# Patient Record
Sex: Female | Born: 1990
Health system: Southern US, Community
[De-identification: ages and names within clinical notes are randomized; demographics above are authoritative.]

## PROBLEM LIST (undated history)

## (undated) ENCOUNTER — Inpatient Hospital Stay (HOSPITAL_COMMUNITY): Payer: Self-pay

## (undated) DIAGNOSIS — I10 Essential (primary) hypertension: Secondary | ICD-10-CM

## (undated) DIAGNOSIS — K8020 Calculus of gallbladder without cholecystitis without obstruction: Secondary | ICD-10-CM

## (undated) DIAGNOSIS — K811 Chronic cholecystitis: Secondary | ICD-10-CM

---
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
## 2010-06-30 ENCOUNTER — Emergency Department (HOSPITAL_COMMUNITY): Admission: EM | Admit: 2010-06-30 | Discharge: 2010-06-30 | Payer: Self-pay | Admitting: Emergency Medicine

## 2011-01-10 NOTE — Op Note (Signed)
NAME:  Kristi Richmond, Kristi Richmond                           ACCOUNT NO.:  [id number]   MEDICAL RECORD NO.:  [id number]                   PATIENT TYPE:  AMB   LOCATION:  DSC                                  FACILITY:  [facility]   PHYSICIAN:  Leonia Corona, M.D.               DATE OF BIRTH:  1990-11-16   DATE OF PROCEDURE:  12/19/2003  DATE OF DISCHARGE:                                 OPERATIVE REPORT   PREOPERATIVE DIAGNOSIS:  Left post-auricular cyst.   POSTOPERATIVE DIAGNOSIS:  Left post-auricular dermoid cyst.   PROCEDURE:  Excision biopsy.   ANESTHESIA:  General laryngeal mask anesthesia.   SURGEON:  Leonia Corona, M.D.   ASSISTANT:  Nurse.   INDICATIONS FOR PROCEDURE:  This 20 year old female child was evaluated for  cystic swelling measuring about 1.5 cm in diameter behind the left ear that  had bene noted since early childhood. However, it was not recently to have  grown in clear clinically consistent with a diagnosis of congenital benign  cyst; hence, the indication for the procedure.   DESCRIPTION OF PROCEDURE:  The patient was brought into the operating room,  placed supine on the operating table and general laryngeal mask anesthesia  was given.  The patient's head was tilted to the right side to make the cyst  prominent.  The ear and the pinna were taped by folding it anteriorly to  expose the cyst completely.  The area was cleaned, prepped and draped in the  usual manner.  A linear vertical excision just sitting over the most  prominent part of the cyst was made measuring about 1.5 cm.  The skin flaps  are raised on either side by sharp and blunt dissection using a fine  instrument.  Once the cyst was freed on both sides, it was lifted off the  base carefully by blunt and sharp dissection.  However, while doing so it  ruptured and serous fluid along with plenty of hair came out which was  suctioned out completely. The entire cyst was carefully lifted off without  leaving any  remnant and the complete cyst came out.  No cyst wall was left  behind.  The wound was irrigated carefully and examined and any bleeding  spots were cauterized. The entire cyst with contents was sent for pathology.  The wound was then closed in two layers; the deeper layer using 6-0 Vicryl  interrupted sutures and the skin with 6-0 Prolene for subcuticular stitch.  Steri-Strips were applied which was covered in sterile gauze and a DuoDerm  dressing.  The patient tolerated the procedure very well  which was  uneventful. The patient was later extubated and transported to the recovery  room in good and stable condition.  Leonia Corona, M.D.   SF/MEDQ  D:  12/19/2003  T:  12/19/2003  Job:  [id number]   cc:   Burnell Blanks, M.D.  [facility]

## 2011-02-05 ENCOUNTER — Other Ambulatory Visit: Payer: Self-pay | Admitting: Family Medicine

## 2011-02-05 DIAGNOSIS — M25512 Pain in left shoulder: Secondary | ICD-10-CM

## 2011-02-05 DIAGNOSIS — M5412 Radiculopathy, cervical region: Secondary | ICD-10-CM

## 2011-02-11 ENCOUNTER — Ambulatory Visit
Admission: RE | Admit: 2011-02-11 | Discharge: 2011-02-11 | Disposition: A | Discharge status: Home / Self Care | Payer: Commercial Managed Care - PPO | Source: Ambulatory Visit | Attending: Family Medicine | Admitting: Family Medicine

## 2011-02-11 DIAGNOSIS — M25512 Pain in left shoulder: Secondary | ICD-10-CM

## 2011-02-11 DIAGNOSIS — M5412 Radiculopathy, cervical region: Secondary | ICD-10-CM

## 2012-03-23 ENCOUNTER — Emergency Department: Payer: Self-pay | Admitting: Emergency Medicine

## 2012-06-24 ENCOUNTER — Emergency Department (HOSPITAL_BASED_OUTPATIENT_CLINIC_OR_DEPARTMENT_OTHER)
Admission: EM | Admit: 2012-06-24 | Discharge: 2012-06-24 | Disposition: A | Discharge status: Home / Self Care | Payer: BC Managed Care – PPO | Attending: Emergency Medicine | Admitting: Emergency Medicine

## 2012-06-24 ENCOUNTER — Encounter (HOSPITAL_BASED_OUTPATIENT_CLINIC_OR_DEPARTMENT_OTHER): Payer: Self-pay

## 2012-06-24 DIAGNOSIS — R51 Headache: Secondary | ICD-10-CM | POA: Insufficient documentation

## 2012-06-24 DIAGNOSIS — Z79899 Other long term (current) drug therapy: Secondary | ICD-10-CM | POA: Insufficient documentation

## 2012-06-24 DIAGNOSIS — I10 Essential (primary) hypertension: Secondary | ICD-10-CM | POA: Insufficient documentation

## 2012-06-24 HISTORY — DX: Essential (primary) hypertension: I10

## 2012-06-24 LAB — BASIC METABOLIC PANEL
BUN: 10 mg/dL (ref 6–23)
Calcium: 9.5 mg/dL (ref 8.4–10.5)
Chloride: 101 mEq/L (ref 96–112)
Creatinine, Ser: 0.8 mg/dL (ref 0.50–1.10)
GFR calc Af Amer: >90 mL/min (ref >90)
Glucose, Bld: 103 mg/dL — ABNORMAL HIGH (ref 70–99)
Potassium: 3.4 mEq/L — ABNORMAL LOW (ref 3.5–5.1)

## 2012-06-24 MED ORDER — DEXAMETHASONE SODIUM PHOSPHATE 10 MG/ML IJ SOLN
10.0000 mg | Freq: Once | INTRAMUSCULAR | Status: AC
  Administered 2012-06-24: 10 mg via INTRAVENOUS
  Filled 2012-06-24: qty 1

## 2012-06-24 MED ORDER — MORPHINE SULFATE 4 MG/ML IJ SOLN
4.0000 mg | Freq: Once | INTRAMUSCULAR | Status: AC
  Administered 2012-06-24: 4 mg via INTRAVENOUS
  Filled 2012-06-24: qty 1

## 2012-06-24 MED ORDER — METOCLOPRAMIDE HCL 5 MG/ML IJ SOLN
10.0000 mg | Freq: Once | INTRAMUSCULAR | Status: AC
  Administered 2012-06-24: 10 mg via INTRAVENOUS
  Filled 2012-06-24: qty 2

## 2012-06-24 MED ORDER — KETOROLAC TROMETHAMINE 30 MG/ML IJ SOLN
30.0000 mg | Freq: Once | INTRAMUSCULAR | Status: AC
  Administered 2012-06-24: 30 mg via INTRAVENOUS
  Filled 2012-06-24: qty 1

## 2012-06-24 MED ORDER — POTASSIUM CHLORIDE CRYS ER 20 MEQ PO TBCR
40.0000 meq | EXTENDED_RELEASE_TABLET | Freq: Once | ORAL | Status: AC
  Administered 2012-06-24: 40 meq via ORAL
  Filled 2012-06-24: qty 2

## 2012-06-24 MED ORDER — HYDROCODONE-ACETAMINOPHEN 5-325 MG PO TABS: 1.0000 | ORAL_TABLET | ORAL | Status: DC | PRN

## 2012-06-24 MED ORDER — IBUPROFEN 600 MG PO TABS: 600.0000 mg | ORAL_TABLET | Freq: Four times a day (QID) | ORAL | Status: DC | PRN

## 2012-06-24 MED ORDER — SODIUM CHLORIDE 0.9 % IV BOLUS (SEPSIS)
1000.0000 mL | Freq: Once | INTRAVENOUS | Status: AC
  Administered 2012-06-24: 1000 mL via INTRAVENOUS

## 2012-06-24 NOTE — ED Provider Notes (Addendum)
History     CSN: [id number]  Arrival date & time 06/24/12  1557   First MD Initiated Contact with Patient 06/24/12 1617      Chief Complaint  Patient presents with  . Headache    (Consider location/radiation/quality/duration/timing/severity/associated sxs/prior treatment) HPI Comments: Pt comes in with cc of headaches. Her headaches started 3 weeks ago, are constant nagging headaches that get more intense occasionally. Pt describes the pain as sharp, located in the front and the back, and there is no nausea, vomiting, visual complains, seizures, altered mental status, loss of consciousness, new weakness, or numbness, no gait instability associated with them.  Pt also reports elevated BP, mostly in the 140s, but has peaked to 180s occasionally. She was seen by PCP, and started on metoprolol in addition to her maxzide. Pt taking tylenol for pain right now.  Patient is a 21 y.o. female presenting with headaches. The history is provided by the patient.  Headache  Pertinent negatives include no shortness of breath, no nausea and no vomiting.    Past Medical History  Diagnosis Date  . Hypertension     History reviewed. No pertinent past surgical history.  No family history on file.  History  Substance Use Topics  . Smoking status: Never Smoker   . Smokeless tobacco: Not on file  . Alcohol Use: No    OB History    Grav Para Term Preterm Abortions TAB SAB Ect Mult Living                  Review of Systems  Constitutional: Negative for activity change.  HENT: Negative for neck pain.   Respiratory: Negative for chest tightness and shortness of breath.   Cardiovascular: Negative for chest pain.  Gastrointestinal: Negative for nausea, vomiting and abdominal pain.  Genitourinary: Negative for dysuria.  Neurological: Positive for headaches.    Allergies  Sulfa antibiotics  Home Medications   Current Outpatient Rx  Name Route Sig Dispense Refill  . METOPROLOL  SUCCINATE ER 50 MG PO TB24 Oral Take 50 mg by mouth daily. Take with or immediately following a meal.    . TRIAMTERENE-HCTZ 75-50 MG PO TABS Oral Take 1 tablet by mouth 2 (two) times daily.      BP 152/96  Pulse 90  Temp 98.3 F (36.8 C)  Resp 20  Ht 5\' 6"  (1.676 m)  Wt 169 lb (76.658 kg)  BMI 27.28 kg/m2  SpO2 100%  LMP 06/23/2012  Physical Exam  Nursing note and vitals reviewed. Constitutional: She is oriented to person, place, and time. She appears well-developed and well-nourished.  HENT:  Head: Normocephalic and atraumatic.  Eyes: EOM are normal. Pupils are equal, round, and reactive to light.  Neck: Neck supple.  Cardiovascular: Normal rate, regular rhythm and normal heart sounds.   No murmur heard. Pulmonary/Chest: Effort normal. No respiratory distress.  Abdominal: Soft. She exhibits no distension. There is no tenderness. There is no rebound and no guarding.  Neurological: She is alert and oriented to person, place, and time. No cranial nerve deficit. Coordination normal.  Skin: Skin is warm and dry.    ED Course  Procedures (including critical care time)   Labs Reviewed  BASIC METABOLIC PANEL   No results found.   No diagnosis found.    MDM  DDX includes: Primary headaches - including migrainous headaches, cluster headaches, tension headaches. ICH Carotid dissection Cavernous sinus thrombosis Meningitis Encephalitis Sinusitis Tumor Vascular headaches AV malformation Brain aneurysm Muscular headaches  A/P:  Pt comes in with cc of headaches. No concerns for life threatening secondary headaches as there are no red flags on hx or exam for them, and the neuro exam is completely normal. Will try to break the headaches.    Derwood Kaplan, MD 06/24/12 1646  5:34 PM Headache improved, but not completely resolved. Will give a dose of decadron. Pain is mild right now, we will discharge her, and request continued follow up with pcp if not  better.  Derwood Kaplan, MD 06/24/12 1735

## 2012-06-24 NOTE — ED Notes (Addendum)
HA x 3 weeks-states PCP has been adjusting HTN meds x 2 weeks-new rx metoprolol

## 2012-09-22 ENCOUNTER — Ambulatory Visit: Payer: Self-pay | Admitting: Podiatry

## 2014-07-20 ENCOUNTER — Emergency Department (HOSPITAL_BASED_OUTPATIENT_CLINIC_OR_DEPARTMENT_OTHER): Payer: BC Managed Care – PPO

## 2014-07-20 ENCOUNTER — Emergency Department (HOSPITAL_BASED_OUTPATIENT_CLINIC_OR_DEPARTMENT_OTHER)
Admission: EM | Admit: 2014-07-20 | Discharge: 2014-07-20 | Disposition: A | Discharge status: Home / Self Care | Payer: BC Managed Care – PPO | Attending: Emergency Medicine | Admitting: Emergency Medicine

## 2014-07-20 ENCOUNTER — Encounter (HOSPITAL_BASED_OUTPATIENT_CLINIC_OR_DEPARTMENT_OTHER): Payer: Self-pay | Admitting: Emergency Medicine

## 2014-07-20 DIAGNOSIS — Z79899 Other long term (current) drug therapy: Secondary | ICD-10-CM | POA: Insufficient documentation

## 2014-07-20 DIAGNOSIS — I10 Essential (primary) hypertension: Secondary | ICD-10-CM | POA: Insufficient documentation

## 2014-07-20 DIAGNOSIS — Z8639 Personal history of other endocrine, nutritional and metabolic disease: Secondary | ICD-10-CM | POA: Insufficient documentation

## 2014-07-20 DIAGNOSIS — R0602 Shortness of breath: Secondary | ICD-10-CM | POA: Insufficient documentation

## 2014-07-20 DIAGNOSIS — R079 Chest pain, unspecified: Secondary | ICD-10-CM | POA: Diagnosis present

## 2014-07-20 LAB — CBC WITH DIFFERENTIAL/PLATELET
Basophils Absolute: 0 10*3/uL (ref 0.0–0.1)
Basophils Relative: 0 % (ref 0–1)
EOS ABS: 0.1 10*3/uL (ref 0.0–0.7)
EOS PCT: 1 % (ref 0–5)
HCT: 34.6 % — ABNORMAL LOW (ref 36.0–46.0)
Hemoglobin: 12.1 g/dL (ref 12.0–15.0)
Lymphocytes Relative: 39 % (ref 12–46)
Lymphs Abs: 3.1 10*3/uL (ref 0.7–4.0)
MCH: 31.5 pg (ref 26.0–34.0)
MCHC: 35 g/dL (ref 30.0–36.0)
MCV: 90.1 fL (ref 78.0–100.0)
Monocytes Absolute: 0.6 10*3/uL (ref 0.1–1.0)
Monocytes Relative: 7 % (ref 3–12)
Neutro Abs: 4.1 10*3/uL (ref 1.7–7.7)
Neutrophils Relative %: 53 % (ref 43–77)
PLATELETS: 274 10*3/uL (ref 150–400)
RBC: 3.84 MIL/uL — ABNORMAL LOW (ref 3.87–5.11)
RDW: 11.9 % (ref 11.5–15.5)
WBC: 7.9 10*3/uL (ref 4.0–10.5)

## 2014-07-20 LAB — BASIC METABOLIC PANEL
ANION GAP: 11 (ref 5–15)
BUN: 11 mg/dL (ref 6–23)
CALCIUM: 9.5 mg/dL (ref 8.4–10.5)
CO2: 25 mEq/L (ref 19–32)
Chloride: 103 mEq/L (ref 96–112)
Creatinine, Ser: 0.9 mg/dL (ref 0.50–1.10)
GFR calc Af Amer: >90 mL/min (ref >90)
GFR, EST NON AFRICAN AMERICAN: 88 mL/min — AB (ref >90)
Glucose, Bld: 94 mg/dL (ref 70–99)
POTASSIUM: 3.9 meq/L (ref 3.7–5.3)
Sodium: 139 mEq/L (ref 137–147)

## 2014-07-20 LAB — TROPONIN I

## 2014-07-20 LAB — D-DIMER, QUANTITATIVE: D-Dimer, Quant: 1.04 ug/mL-FEU — ABNORMAL HIGH (ref 0.00–0.48)

## 2014-07-20 MED ORDER — SODIUM CHLORIDE 0.9 % IV BOLUS (SEPSIS)
1000.0000 mL | Freq: Once | INTRAVENOUS | Status: AC
  Administered 2014-07-20: 1000 mL via INTRAVENOUS

## 2014-07-20 MED ORDER — METOCLOPRAMIDE HCL 5 MG/ML IJ SOLN
10.0000 mg | Freq: Once | INTRAMUSCULAR | Status: AC
  Administered 2014-07-20: 10 mg via INTRAVENOUS
  Filled 2014-07-20: qty 2

## 2014-07-20 MED ORDER — DIPHENHYDRAMINE HCL 50 MG/ML IJ SOLN
25.0000 mg | Freq: Once | INTRAMUSCULAR | Status: AC
  Administered 2014-07-20: 25 mg via INTRAVENOUS
  Filled 2014-07-20: qty 1

## 2014-07-20 MED ORDER — NAPROXEN 500 MG PO TABS: 500.0000 mg | ORAL_TABLET | Freq: Two times a day (BID) | ORAL | Status: DC

## 2014-07-20 MED ORDER — KETOROLAC TROMETHAMINE 30 MG/ML IJ SOLN
30.0000 mg | Freq: Once | INTRAMUSCULAR | Status: AC
  Administered 2014-07-20: 30 mg via INTRAVENOUS
  Filled 2014-07-20: qty 1

## 2014-07-20 MED ORDER — IOHEXOL 350 MG/ML SOLN
100.0000 mL | Freq: Once | INTRAVENOUS | Status: AC | PRN
  Administered 2014-07-20: 100 mL via INTRAVENOUS

## 2014-07-20 NOTE — Discharge Instructions (Signed)
Please follow the directions provided.  Be sure to follow-up with your primary care provider to ensure you are getting better.  You may take the Naproxen twice a day for pain.  Don't hesitate to return for new, worsening or concerning symptoms.     SEEK IMMEDIATE MEDICAL CARE IF:  You have increased chest pain or pain that spreads to your arm, neck, jaw, back, or abdomen.  You have shortness of breath.  You have an increasing cough, or you cough up blood.  You have severe back or abdominal pain.  You feel nauseous or vomit.  You have severe weakness.  You faint.  You have chills.

## 2014-07-20 NOTE — ED Notes (Signed)
Patient transported to CT 

## 2014-07-20 NOTE — ED Notes (Signed)
Lanora ManisElizabeth, FNP at bedside.

## 2014-07-20 NOTE — ED Notes (Signed)
Tightness in right upper chest that is worse with deep breath started at 1pm today.  Also numbness and stiffness in right hand.  Also HA.  All started today with no hx of same.

## 2014-07-20 NOTE — ED Provider Notes (Signed)
CSN: [id number]     Arrival date & time 07/20/14  1611 History   First MD Initiated Contact with Patient 07/20/14 1627     Chief Complaint  Patient presents with  . Chest Pain   (Consider location/radiation/quality/duration/timing/severity/associated sxs/prior Treatment) HPI  Kristi Richmond is a 23 yo female presenting with right sided chest pain x 2 hours.  She noticed the pain as she was sitting down.  She describes it as a constant, sharp pain, worse when she takes a deep breath.  She rates the pain as 6/10.  She also notes having a headache that began 1 hr after the headache. She denies fevers, chills, nausea, vomiting, abd pain or diarrhea.  She does take oral contraception, she denies history of DVT, no hemoptysis, no surgery or unilateral leg swelling.         Past Medical History  Diagnosis Date  . Hypertension   . High cholesterol    History reviewed. No pertinent past surgical history. No family history on file. History  Substance Use Topics  . Smoking status: Never Smoker   . Smokeless tobacco: Not on file  . Alcohol Use: Yes     Comment: occ   OB History    No data available     Review of Systems  Constitutional: Negative for fever and chills.  HENT: Negative for sore throat.   Eyes: Negative for visual disturbance.  Respiratory: Positive for chest tightness and shortness of breath. Negative for cough.   Cardiovascular: Positive for chest pain. Negative for leg swelling.  Gastrointestinal: Negative for nausea, vomiting and diarrhea.  Genitourinary: Negative for dysuria.  Musculoskeletal: Negative for myalgias.  Skin: Negative for rash.  Neurological: Negative for weakness, numbness and headaches.    Allergies  Sulfa antibiotics  Home Medications   Prior to Admission medications   Medication Sig Start Date End Date Taking? Authorizing Provider  HYDROcodone-acetaminophen (NORCO/VICODIN) 5-325 MG per tablet Take 1 tablet by mouth every 4 (four) hours as  needed for pain (Breakthrough headache only). 06/24/12   Derwood Kaplan, MD  ibuprofen (ADVIL,MOTRIN) 600 MG tablet Take 1 tablet (600 mg total) by mouth every 6 (six) hours as needed for pain. 06/24/12   Derwood Kaplan, MD  metoprolol succinate (TOPROL-XL) 50 MG 24 hr tablet Take 50 mg by mouth daily. Take with or immediately following a meal.    Historical Provider, MD  triamterene-hydrochlorothiazide (MAXZIDE) 75-50 MG per tablet Take 1 tablet by mouth 2 (two) times daily.    Historical Provider, MD  triamterene-hydrochlorothiazide (MAXZIDE-25) 37.5-25 MG per tablet Take 1 tablet by mouth daily.   Yes Historical Provider, MD   BP 134/84 mmHg  Pulse 77  Temp(Src) 98.4 F (36.9 C) (Oral)  Resp 16  Ht 5\' 6"  (1.676 m)  Wt 165 lb (74.844 kg)  BMI 26.64 kg/m2  SpO2 100%  LMP 07/06/2014 (Approximate) Physical Exam  Constitutional: She appears well-developed and well-nourished. No distress.  HENT:  Head: Normocephalic and atraumatic.  Mouth/Throat: Oropharynx is clear and moist. No oropharyngeal exudate.  Eyes: Conjunctivae are normal.  Neck: Neck supple. No thyromegaly present.  Cardiovascular: Normal rate, regular rhythm and intact distal pulses.   Pulmonary/Chest: Effort normal and breath sounds normal. No respiratory distress. She has no wheezes. She has no rales. She exhibits no tenderness.    Abdominal: Soft. There is no tenderness.  Musculoskeletal: She exhibits no tenderness.  Lymphadenopathy:    She has no cervical adenopathy.  Neurological: She is alert.  Skin: Skin  is warm and dry. No rash noted. She is not diaphoretic.  Psychiatric: She has a normal mood and affect.  Nursing note and vitals reviewed.   ED Course  Procedures (including critical care time) Labs Review Labs Reviewed  CBC WITH DIFFERENTIAL - Abnormal; Notable for the following:    RBC 3.84 (*)    HCT 34.6 (*)    All other components within normal limits  BASIC METABOLIC PANEL - Abnormal; Notable for  the following:    GFR calc non Af Amer 88 (*)    All other components within normal limits  D-DIMER, QUANTITATIVE - Abnormal; Notable for the following:    D-Dimer, Quant 1.04 (*)    All other components within normal limits  TROPONIN I    Imaging Review Dg Chest 2 View  07/20/2014   CLINICAL DATA:  Sudden right-sided chest pain. Right arm tingling and numbness. Nonsmoker.  EXAM: CHEST  2 VIEW  COMPARISON:  07/01/2005  FINDINGS: The heart size and mediastinal contours are within normal limits. Both lungs are clear. The visualized skeletal structures are unremarkable.  IMPRESSION: No active cardiopulmonary disease.   Electronically Signed   By: Rosalie GumsBeth  Brown M.D.   On: 07/20/2014 16:45   Ct Angio Chest Pe W/cm &/or Wo Cm  07/20/2014   CLINICAL DATA:  Right-sided chest pain, right arm tingling and numbness.  EXAM: CT ANGIOGRAPHY CHEST WITH CONTRAST  TECHNIQUE: Multidetector CT imaging of the chest was performed using the standard protocol during bolus administration of intravenous contrast. Multiplanar CT image reconstructions and MIPs were obtained to evaluate the vascular anatomy.  CONTRAST:  100mL OMNIPAQUE IOHEXOL 350 MG/ML SOLN  COMPARISON:  None.  FINDINGS: Mediastinum: Normal heart size. No pericardial effusion. Trachea and esophagus are normal and midline. No mediastinal or hilar adenopathy identified. The main pulmonary artery appears patent. No abnormal filling defects identified within the lobar or segmental pulmonary arteries.  Lungs/Pleura: There is no pleural effusion identified. There is no airspace consolidation or atelectasis.  Upper Abdomen: Incidental imaging through the upper abdomen is unremarkable.  Musculoskeletal: No acute bone abnormality identified.  Review of the MIP images confirms the above findings.  IMPRESSION: 1. No acute cardiopulmonary abnormalities. No evidence for acute pulmonary embolus.   Electronically Signed   By: Signa Kellaylor  Stroud M.D.   On: 07/20/2014 18:41      EKG Interpretation   Date/Time:  Thursday July 20 2014 16:19:48 EST Ventricular Rate:  75 PR Interval:  140 QRS Duration: 86 QT Interval:  354 QTC Calculation: 395 R Axis:   62 Text Interpretation:  Normal sinus rhythm Nonspecific T wave abnormality  Abnormal ECG No significant change was found Confirmed by Manus GunningANCOUR  MD,  STEPHEN [phone]([id number]) on 07/20/2014 4:27:13 PM      MDM   Final diagnoses:  Shortness of breath   23 yo female with right sided chest pain worse with deep breath.  Pt is not perc negative because of her OCP use, will check d-dimer. With history of hypertension and hyperlipidemia, will check troponin and EKG.  Also CBC, and BMP.    Labs reviewed.  No acute abnormalities except D-dimer elevated. Case discussed with Dr. Manus Gunningancour.  Will proceed with CT angio chest.  CT scan negative. Pt's headache and chest tightness improved after IVF and meds.    Pt is well-appearing, in no acute distress and vital signs are stable.  She appears safe to be discharged.  Discharge include follow-up with her PCP and prescription for anti-inflammatory meds.  Return precautions provided.  Pt aware of plan and in agreement.     Filed Vitals:   07/20/14 1619 07/20/14 1808 07/20/14 1856  BP: 134/84 116/68 119/70  Pulse: 77 75 72  Temp: 98.4 F (36.9 C)    TempSrc: Oral    Resp: 16 20 18   Height: 5\' 6"  (1.676 m)    Weight: 165 lb (74.844 kg)    SpO2: 100% 100% 100%   Meds given in ED:  Medications  sodium chloride 0.9 % bolus 1,000 mL (0 mLs Intravenous Stopped 07/20/14 1839)  ketorolac (TORADOL) 30 MG/ML injection 30 mg (30 mg Intravenous Given 07/20/14 1719)  diphenhydrAMINE (BENADRYL) injection 25 mg (25 mg Intravenous Given 07/20/14 1716)  metoCLOPramide (REGLAN) injection 10 mg (10 mg Intravenous Given 07/20/14 1720)  iohexol (OMNIPAQUE) 350 MG/ML injection 100 mL (100 mLs Intravenous Contrast Given 07/20/14 1808)    Discharge Medication List as of 07/20/2014  7:00 PM      START taking these medications   Details  naproxen (NAPROSYN) 500 MG tablet Take 1 tablet (500 mg total) by mouth 2 (two) times daily with a meal., Starting 07/20/2014, Until Discontinued, Print           Harle BattiestElizabeth Tanicia Wolaver, NP 07/22/14 0020  Glynn OctaveStephen Rancour, MD 07/22/14 1012

## 2015-04-18 IMAGING — CT CT ANGIO CHEST
2 of 6 series · 19 of 36 positions shown · IV contrast (APPLIED)
Comparison: None.

CLINICAL DATA: Right-sided chest pain, right arm tingling and
numbness.

EXAM:
CT ANGIOGRAPHY CHEST WITH CONTRAST
TECHNIQUE: Multidetector CT imaging of the chest was performed using the
standard protocol during bolus administration of intravenous
contrast. Multiplanar CT image reconstructions and MIPs were
obtained to evaluate the vascular anatomy.
CONTRAST:  100mL OMNIPAQUE IOHEXOL 350 MG/ML SOLN

[Series 7: pe 1.0 b26f · axial · 0.57mm/px · z∈[-316,-74]mm · 18 of 270 slices shown]
[im 14/270  lung]
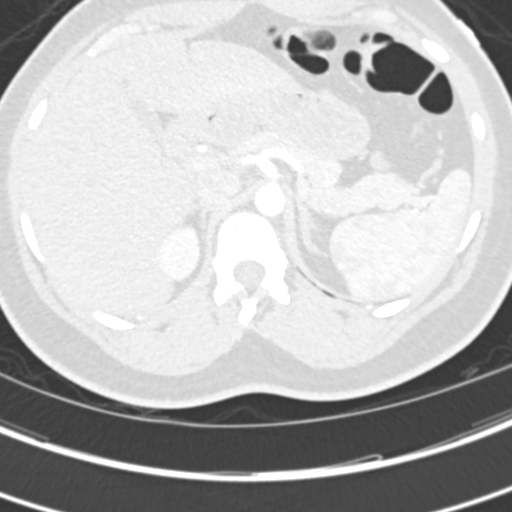
[im 27/270  mediastinal]
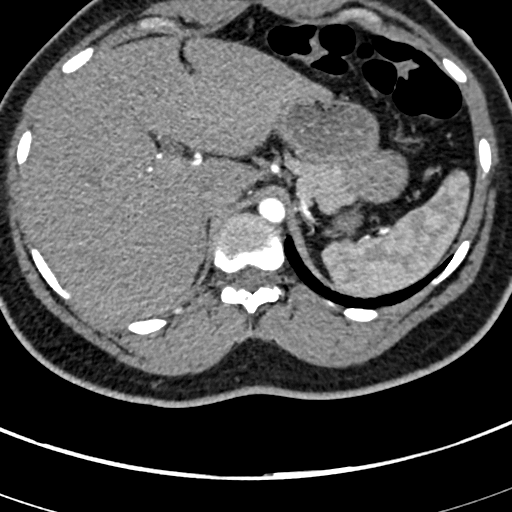
[im 41/270  lung]
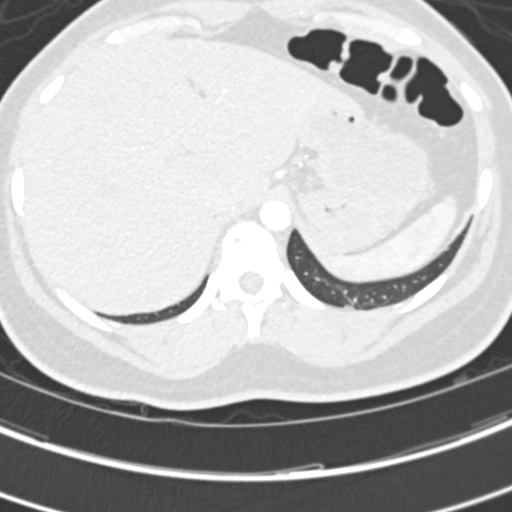
[im 54/270  mediastinal]
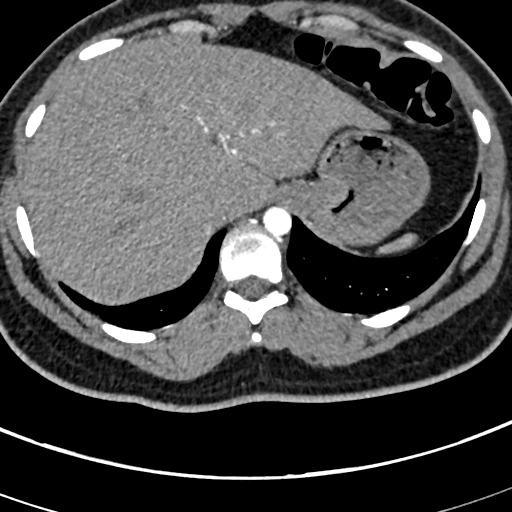
[im 68/270  lung]
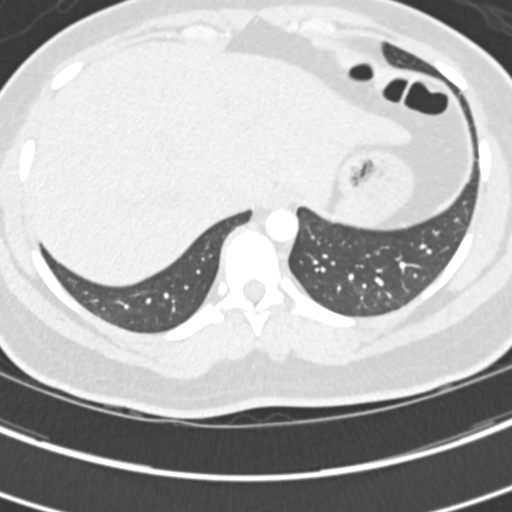
[im 81/270  mediastinal]
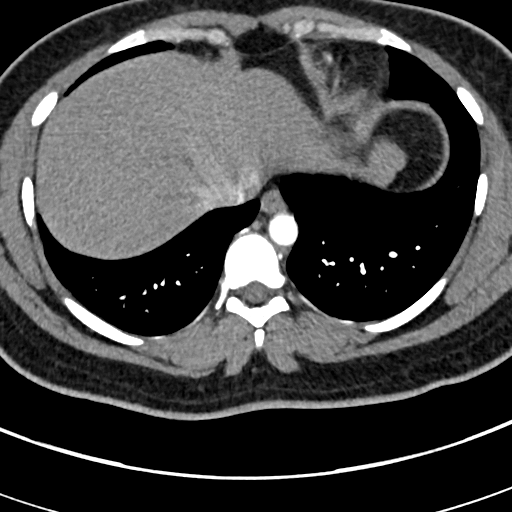
[im 95/270  lung]
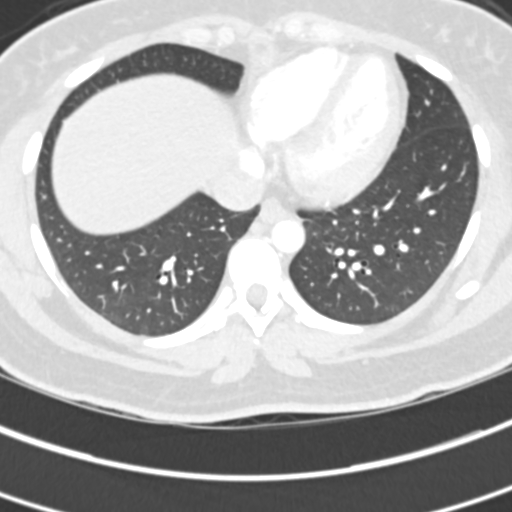
[im 108/270  mediastinal]
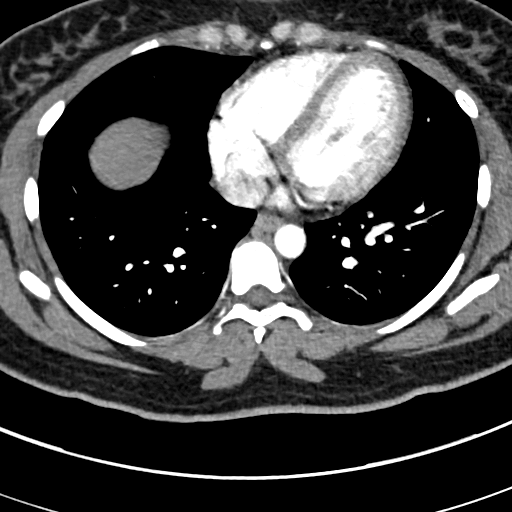
[im 122/270  lung]
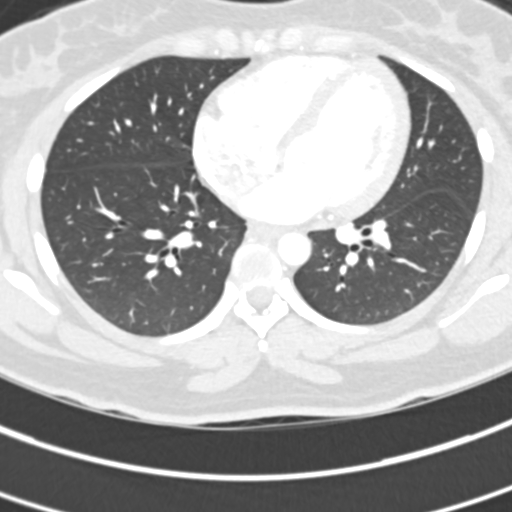
[im 148/270  mediastinal]
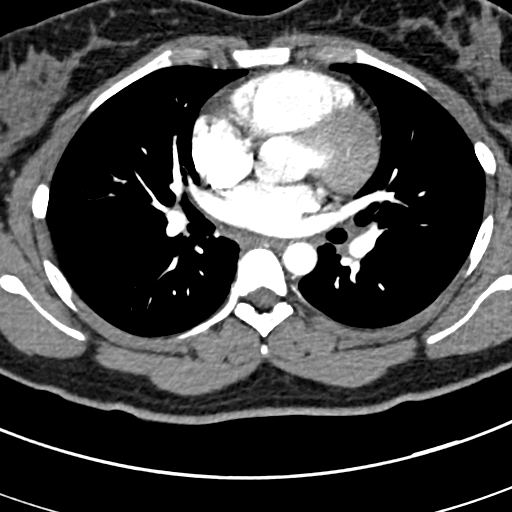
[im 162/270  lung]
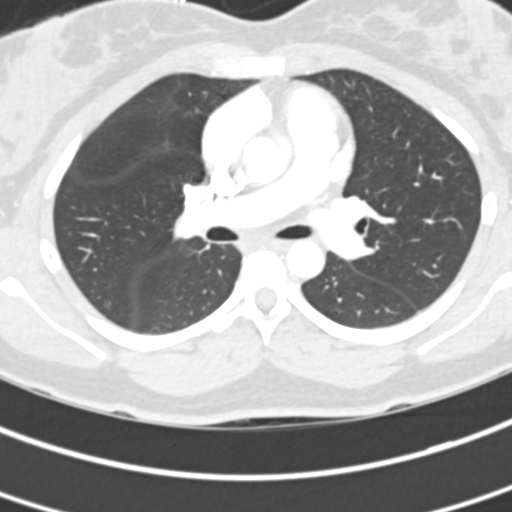
[im 175/270  mediastinal]
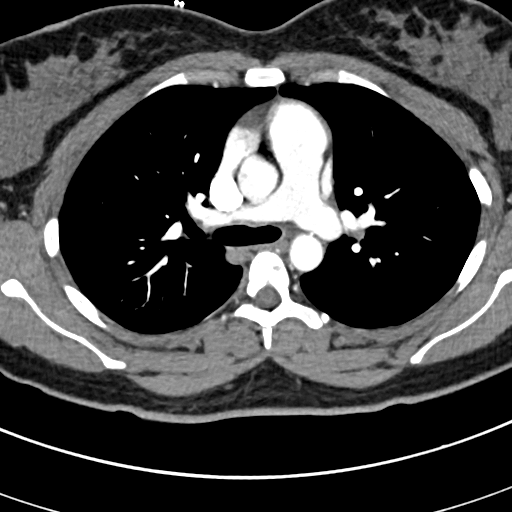
[im 189/270  lung]
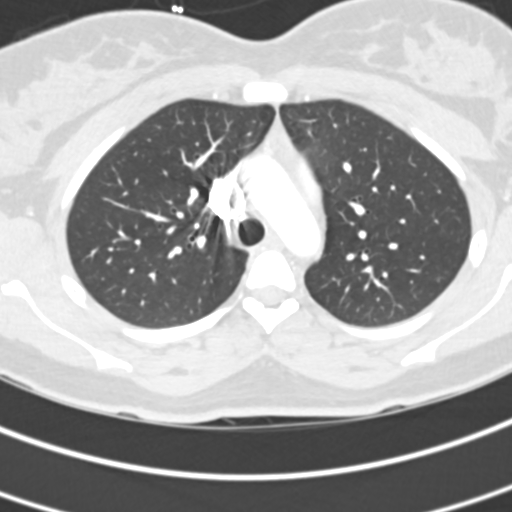
[im 202/270  mediastinal]
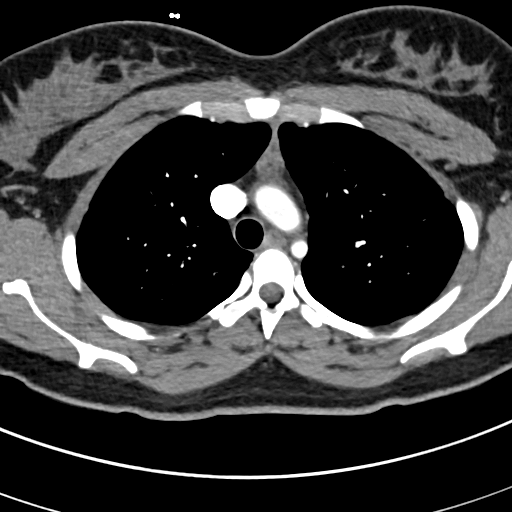
[im 216/270  lung]
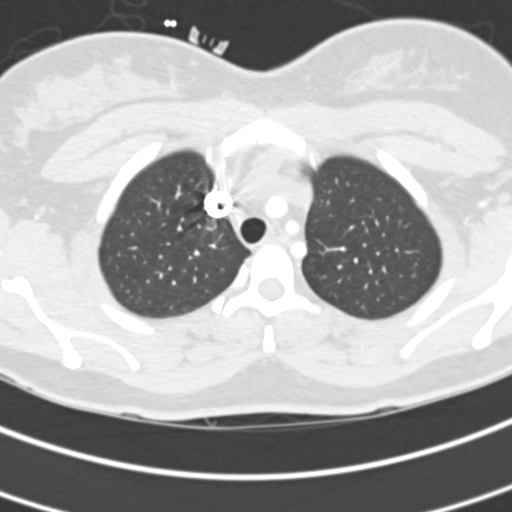
[im 229/270  mediastinal]
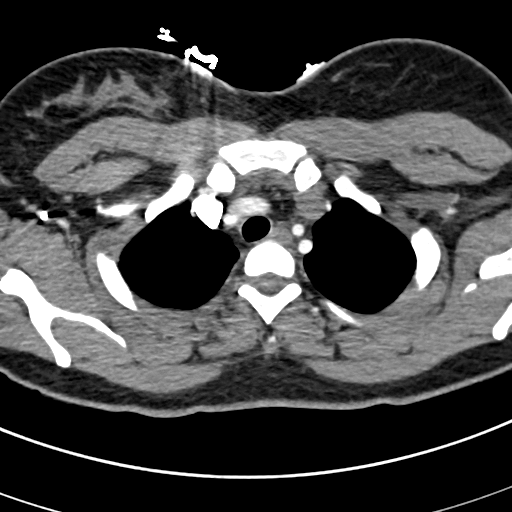
[im 243/270  lung]
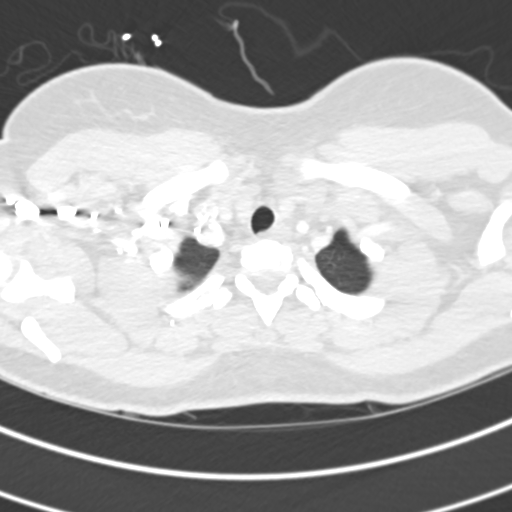
[im 256/270  mediastinal]
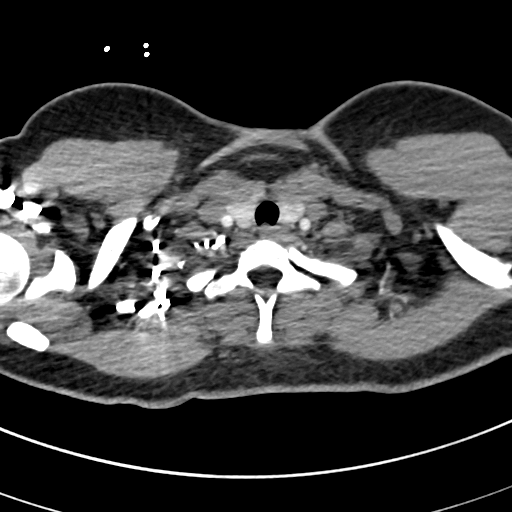

[Series 10: pe 2.0 coronal · coronal · 0.55mm/px · 1 of 115 slices shown]
[im 58/115  mediastinal]
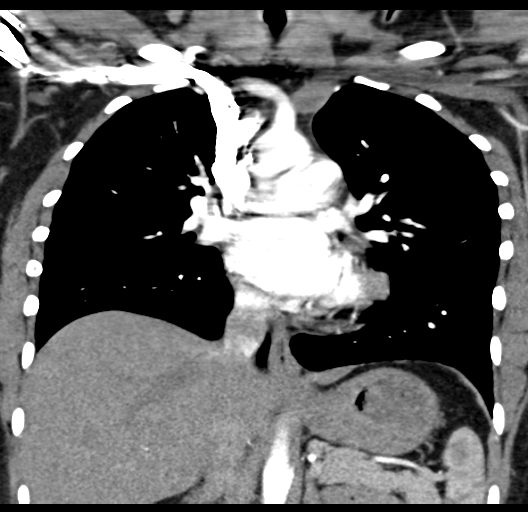

[19 of 36 positions shown; findings below may reference images not displayed]

FINDINGS: Mediastinum: Normal heart size. No pericardial effusion. Trachea and
esophagus are normal and midline. No mediastinal or hilar adenopathy
identified. The main pulmonary artery appears patent. No abnormal
filling defects identified within the lobar or segmental pulmonary
arteries.

Lungs/Pleura: There is no pleural effusion identified. There is no
airspace consolidation or atelectasis.

Upper Abdomen: Incidental imaging through the upper abdomen is
unremarkable.

Musculoskeletal: No acute bone abnormality identified.

Review of the MIP images confirms the above findings.
IMPRESSION: 1. No acute cardiopulmonary abnormalities. No evidence for acute
pulmonary embolus.

## 2015-08-28 ENCOUNTER — Ambulatory Visit (INDEPENDENT_AMBULATORY_CARE_PROVIDER_SITE_OTHER): Payer: 59 | Admitting: Physician Assistant

## 2015-08-28 VITALS — BP 132/80 | HR 79 | Temp 98.1°F | Resp 20 | Ht 65.0 in | Wt 177.0 lb

## 2015-08-28 DIAGNOSIS — J988 Other specified respiratory disorders: Principal | ICD-10-CM

## 2015-08-28 DIAGNOSIS — R05 Cough: Secondary | ICD-10-CM

## 2015-08-28 DIAGNOSIS — R059 Cough, unspecified: Secondary | ICD-10-CM

## 2015-08-28 DIAGNOSIS — R0981 Nasal congestion: Secondary | ICD-10-CM

## 2015-08-28 DIAGNOSIS — R5383 Other fatigue: Secondary | ICD-10-CM | POA: Diagnosis not present

## 2015-08-28 DIAGNOSIS — R5381 Other malaise: Secondary | ICD-10-CM

## 2015-08-28 DIAGNOSIS — B9789 Other viral agents as the cause of diseases classified elsewhere: Secondary | ICD-10-CM

## 2015-08-28 DIAGNOSIS — B349 Viral infection, unspecified: Secondary | ICD-10-CM | POA: Diagnosis not present

## 2015-08-28 LAB — POCT CBC
Granulocyte percent: 49 %G (ref 37–80)
HCT, POC: 41.7 % (ref 37.7–47.9)
HEMOGLOBIN: 14.3 g/dL (ref 12.2–16.2)
LYMPH, POC: 3.5 — AB (ref 0.6–3.4)
MCH, POC: 31.2 pg (ref 27–31.2)
MCHC: 34.2 g/dL (ref 31.8–35.4)
MCV: 91.1 fL (ref 80–97)
MID (cbc): 0.6 (ref 0–0.9)
MPV: 7.3 fL (ref 0–99.8)
POC Granulocyte: 4 (ref 2–6.9)
POC LYMPH %: 43.2 % (ref 10–50)
POC MID %: 7.8 % (ref 0–12)
Platelet Count, POC: 284 10*3/uL (ref 142–424)
RBC: 4.57 M/uL (ref 4.04–5.48)
RDW, POC: 12.2 %
WBC: 8.2 10*3/uL (ref 4.6–10.2)

## 2015-08-28 LAB — POCT INFLUENZA A/B
Influenza A, POC: NEGATIVE
Influenza B, POC: NEGATIVE

## 2015-08-28 MED ORDER — HYDROCODONE-HOMATROPINE 5-1.5 MG/5ML PO SYRP: 2.5000 mL | ORAL_SOLUTION | Freq: Every evening | ORAL | Status: DC | PRN

## 2015-08-28 MED ORDER — IBUPROFEN 600 MG PO TABS: 600.0000 mg | ORAL_TABLET | Freq: Three times a day (TID) | ORAL | Status: DC | PRN

## 2015-08-28 MED ORDER — LORATADINE-PSEUDOEPHEDRINE ER 10-240 MG PO TB24: 1.0000 | ORAL_TABLET | Freq: Every day | ORAL | Status: DC

## 2015-08-28 NOTE — Progress Notes (Signed)
08/28/2015 8:30 PM   DOB: April 19, 1991 / MRN: [id number]  SUBJECTIVE:  Kristi Richmond is a 25 y.o. female presenting for for the evaluation of congestion, fever, cough and sore throat that started 4 days ago.  Associated symptoms include runny nose today and she denies difficulty breathing, headache and jaw pain. Treatments tried thus far include OTC Sudafed which has helped with fever.  She reports sick contacts. She has had the flu shot.    She is allergic to sulfa antibiotics.   She  has a past medical history of Hypertension and High cholesterol.    She  reports that she has never smoked. She does not have any smokeless tobacco history on file. She reports that she drinks alcohol. She reports that she does not use illicit drugs. She  reports that she currently engages in sexual activity. She reports using the following method of birth control/protection: Pill. The patient  has no past surgical history on file.  Her family history is not on file.  Review of Systems  Eyes: Negative for blurred vision.  Respiratory: Negative for shortness of breath and wheezing.   Cardiovascular: Negative for chest pain.  Gastrointestinal: Negative for nausea and abdominal pain.  Genitourinary: Negative for dysuria, urgency and frequency.  Musculoskeletal: Negative for myalgias.  Skin: Negative for rash.  Neurological: Negative for dizziness.  Psychiatric/Behavioral: Negative for depression. The patient is not nervous/anxious.     Problem list and medications reviewed and updated by myself where necessary, and exist elsewhere in the encounter.   OBJECTIVE:  BP 132/80 mmHg  Pulse 79  Temp(Src) 98.1 F (36.7 C) (Oral)  Resp 20  Ht 5\' 5"  (1.651 m)  Wt 177 lb (80.287 kg)  BMI 29.45 kg/m2  SpO2 98%  LMP 08/05/2015  Physical Exam  Constitutional: She is oriented to person, place, and time. Vital signs are normal. She appears ill.  HENT:  Right Ear: Hearing and tympanic membrane normal.  Left  Ear: Hearing and tympanic membrane normal.  Nose: Nose normal.  Mouth/Throat: Uvula is midline, oropharynx is clear and moist and mucous membranes are normal.  Cardiovascular: Normal rate, regular rhythm and normal heart sounds.   Pulmonary/Chest: Effort normal and breath sounds normal.  Abdominal: Soft. Bowel sounds are normal.  Musculoskeletal: Normal range of motion.  Neurological: She is alert and oriented to person, place, and time. No cranial nerve deficit.  Skin: Skin is warm and dry.  Psychiatric: She has a normal mood and affect.    Results for orders placed or performed in visit on 08/28/15 (from the past 48 hour(s))  POCT CBC     Status: Abnormal   Collection Time: 08/28/15  8:20 PM  Result Value Ref Range   WBC 8.2 4.6 - 10.2 K/uL   Lymph, poc 3.5 (A) 0.6 - 3.4   POC LYMPH PERCENT 43.2 10 - 50 %L   MID (cbc) 0.6 0 - 0.9   POC MID % 7.8 0 - 12 %M   POC Granulocyte 4.0 2 - 6.9   Granulocyte percent 49.0 37 - 80 %G   RBC 4.57 4.04 - 5.48 M/uL   Hemoglobin 14.3 12.2 - 16.2 g/dL   HCT, POC 40.9 81.1 - 47.9 %   MCV 91.1 80 - 97 fL   MCH, POC 31.2 27 - 31.2 pg   MCHC 34.2 31.8 - 35.4 g/dL   RDW, POC 91.4 %   Platelet Count, POC 284 142 - 424 K/uL   MPV 7.3 0 -  99.8 fL  POCT Influenza A/B     Status: None   Collection Time: 08/28/15  8:28 PM  Result Value Ref Range   Influenza A, POC Negative Negative   Influenza B, POC Negative Negative    ASSESSMENT AND PLAN:  Renard Hampersha was seen today for cough, chest pain, ear pain and fever.  Diagnoses and all orders for this visit:  Viral respiratory illness: RTC in 7 days if not improved.  Sooner if worse or symptoms change.   Malaise and fatigue -     POCT CBC -     POCT Influenza A/B -     ibuprofen (ADVIL,MOTRIN) 600 MG tablet; Take 1 tablet (600 mg total) by mouth every 8 (eight) hours as needed.  Nasal congestion -     loratadine-pseudoephedrine (CLARITIN-D 24 HOUR) 10-240 MG 24 hr tablet; Take 1 tablet by mouth  daily.  Cough -     POCT CBC -     POCT Influenza A/B -     HYDROcodone-homatropine (HYCODAN) 5-1.5 MG/5ML syrup; Take 2.5-5 mLs by mouth at bedtime as needed.    The patient was advised to call or return to clinic if she does not see an improvement in symptoms or to seek the care of the closest emergency department if she worsens with the above plan.   Deliah BostonMichael Chantale Leugers, MHS, PA-C [facility] 08/28/2015 8:30 PM

## 2016-02-18 ENCOUNTER — Other Ambulatory Visit: Payer: Self-pay | Admitting: Obstetrics & Gynecology

## 2016-02-19 LAB — CYTOLOGY - PAP

## 2016-04-21 ENCOUNTER — Other Ambulatory Visit: Payer: Self-pay | Admitting: Obstetrics & Gynecology

## 2016-04-24 ENCOUNTER — Other Ambulatory Visit: Payer: Self-pay | Admitting: Obstetrics & Gynecology

## 2016-05-20 LAB — OB RESULTS CONSOLE ANTIBODY SCREEN: ANTIBODY SCREEN: NEGATIVE

## 2016-05-20 LAB — OB RESULTS CONSOLE ABO/RH: RH Type: POSITIVE

## 2016-05-20 LAB — OB RESULTS CONSOLE RUBELLA ANTIBODY, IGM: RUBELLA: IMMUNE

## 2016-05-20 LAB — OB RESULTS CONSOLE HEPATITIS B SURFACE ANTIGEN: Hepatitis B Surface Ag: NEGATIVE

## 2016-05-20 LAB — OB RESULTS CONSOLE HIV ANTIBODY (ROUTINE TESTING): HIV: NONREACTIVE

## 2016-05-20 LAB — OB RESULTS CONSOLE GC/CHLAMYDIA
Chlamydia: NEGATIVE
Gonorrhea: NEGATIVE

## 2016-05-20 LAB — OB RESULTS CONSOLE RPR: RPR: NONREACTIVE

## 2016-08-25 NOTE — L&D Delivery Note (Signed)
Delivery Note At 2:46 PM a vaible female was delivered via Vaginal, Spontaneous Delivery APGAR: 8, 9; weight  pending.   Placenta status: delivered intact   Anesthesia:   Episiotomy: None Lacerations: vaginal wall - 4mm Suture Repair: 3-0 vicryl- 2 figure of 8s Est. Blood Loss (mL): 100  Mom to postpartum.  Baby to Couplet care / Skin to Skin.  Philip Aspen 12/01/2016, 3:33 PM

## 2016-10-30 ENCOUNTER — Other Ambulatory Visit: Payer: Self-pay | Admitting: Obstetrics and Gynecology

## 2016-10-30 LAB — OB RESULTS CONSOLE GBS: STREP GROUP B AG: POSITIVE

## 2016-11-20 ENCOUNTER — Ambulatory Visit (INDEPENDENT_AMBULATORY_CARE_PROVIDER_SITE_OTHER): Payer: Self-pay | Admitting: Pediatrics

## 2016-11-20 DIAGNOSIS — Z7681 Expectant parent(s) prebirth pediatrician visit: Secondary | ICD-10-CM

## 2016-11-20 DIAGNOSIS — Z3490 Encounter for supervision of normal pregnancy, unspecified, unspecified trimester: Secondary | ICD-10-CM

## 2016-11-23 NOTE — Progress Notes (Signed)
Prenatal counseling for impending newborn done-- 1st child, currently 38wks, h/o chronic HTN, prenatal started 7wks Z40.81

## 2016-11-25 ENCOUNTER — Encounter (HOSPITAL_COMMUNITY): Payer: Self-pay | Admitting: *Deleted

## 2016-11-25 ENCOUNTER — Telehealth (HOSPITAL_COMMUNITY): Payer: Self-pay | Admitting: *Deleted

## 2016-11-25 NOTE — Telephone Encounter (Signed)
Preadmission screen  

## 2016-12-01 ENCOUNTER — Encounter (HOSPITAL_COMMUNITY): Payer: Self-pay

## 2016-12-01 ENCOUNTER — Inpatient Hospital Stay (HOSPITAL_COMMUNITY)
Admission: RE | Admit: 2016-12-01 | Discharge: 2016-12-03 | DRG: 774 | Disposition: A | Discharge status: Home / Self Care | Payer: 59 | Source: Ambulatory Visit | Attending: Obstetrics | Admitting: Obstetrics

## 2016-12-01 ENCOUNTER — Inpatient Hospital Stay (HOSPITAL_COMMUNITY): Payer: 59 | Admitting: Anesthesiology

## 2016-12-01 ENCOUNTER — Encounter (HOSPITAL_COMMUNITY)
Admission: RE | Disposition: A | Discharge status: Home / Self Care | Payer: Self-pay | Source: Ambulatory Visit | Attending: Obstetrics

## 2016-12-01 DIAGNOSIS — O99824 Streptococcus B carrier state complicating childbirth: Secondary | ICD-10-CM | POA: Diagnosis present

## 2016-12-01 DIAGNOSIS — O1002 Pre-existing essential hypertension complicating childbirth: Principal | ICD-10-CM | POA: Diagnosis present

## 2016-12-01 DIAGNOSIS — Z3A39 39 weeks gestation of pregnancy: Secondary | ICD-10-CM | POA: Diagnosis not present

## 2016-12-01 DIAGNOSIS — O10019 Pre-existing essential hypertension complicating pregnancy, unspecified trimester: Secondary | ICD-10-CM | POA: Diagnosis present

## 2016-12-01 LAB — CBC
HCT: 34.5 % — ABNORMAL LOW (ref 36.0–46.0)
HEMATOCRIT: 34 % — AB (ref 36.0–46.0)
Hemoglobin: 11.9 g/dL — ABNORMAL LOW (ref 12.0–15.0)
Hemoglobin: 11.8 g/dL — ABNORMAL LOW (ref 12.0–15.0)
MCH: 31.8 pg (ref 26.0–34.0)
MCH: 32 pg (ref 26.0–34.0)
MCHC: 34.5 g/dL (ref 30.0–36.0)
MCHC: 34.7 g/dL (ref 30.0–36.0)
MCV: 92.1 fL (ref 78.0–100.0)
MCV: 92.2 fL (ref 78.0–100.0)
PLATELETS: 195 10*3/uL (ref 150–400)
PLATELETS: 209 10*3/uL (ref 150–400)
RBC: 3.74 MIL/uL — ABNORMAL LOW (ref 3.87–5.11)
RBC: 3.69 MIL/uL — ABNORMAL LOW (ref 3.87–5.11)
RDW: 13.4 % (ref 11.5–15.5)
RDW: 13.5 % (ref 11.5–15.5)
WBC: 7.6 10*3/uL (ref 4.0–10.5)
WBC: 6.5 10*3/uL (ref 4.0–10.5)

## 2016-12-01 LAB — TYPE AND SCREEN
ABO/RH(D): O POS
Antibody Screen: NEGATIVE

## 2016-12-01 LAB — ABO/RH: ABO/RH(D): O POS

## 2016-12-01 SURGERY — Surgical Case
Anesthesia: Regional

## 2016-12-01 MED ORDER — TERBUTALINE SULFATE 1 MG/ML IJ SOLN
0.2500 mg | Freq: Once | INTRAMUSCULAR | Status: DC | PRN
  Filled 2016-12-01: qty 1

## 2016-12-01 MED ORDER — OXYCODONE-ACETAMINOPHEN 5-325 MG PO TABS: 1.0000 | ORAL_TABLET | ORAL | Status: DC | PRN

## 2016-12-01 MED ORDER — FENTANYL 2.5 MCG/ML BUPIVACAINE 1/10 % EPIDURAL INFUSION (WH - ANES)
14.0000 mL/h | INTRAMUSCULAR | Status: DC | PRN
  Administered 2016-12-01: 14 mL/h via EPIDURAL
  Filled 2016-12-01: qty 100

## 2016-12-01 MED ORDER — SOD CITRATE-CITRIC ACID 500-334 MG/5ML PO SOLN
30.0000 mL | ORAL | Status: DC | PRN
  Filled 2016-12-01: qty 15

## 2016-12-01 MED ORDER — BENZOCAINE-MENTHOL 20-0.5 % EX AERO
1.0000 "application " | INHALATION_SPRAY | CUTANEOUS | Status: DC | PRN
  Administered 2016-12-01: 1 via TOPICAL
  Filled 2016-12-01: qty 56

## 2016-12-01 MED ORDER — LIDOCAINE HCL (PF) 1 % IJ SOLN
30.0000 mL | INTRAMUSCULAR | Status: DC | PRN
Start: 2016-12-01 — End: 2016-12-01
  Filled 2016-12-01: qty 30

## 2016-12-01 MED ORDER — LACTATED RINGERS IV SOLN
INTRAVENOUS | Status: DC
  Administered 2016-12-01: 11:00:00 via INTRAUTERINE

## 2016-12-01 MED ORDER — LIDOCAINE HCL (PF) 1 % IJ SOLN
INTRAMUSCULAR | Status: DC | PRN
  Administered 2016-12-01: 2 mL
  Administered 2016-12-01: 5 mL
  Administered 2016-12-01: 3 mL

## 2016-12-01 MED ORDER — TERBUTALINE SULFATE 1 MG/ML IJ SOLN
0.2500 mg | Freq: Once | INTRAMUSCULAR | Status: AC | PRN
  Administered 2016-12-01: 0.25 mg via SUBCUTANEOUS

## 2016-12-01 MED ORDER — ACETAMINOPHEN 325 MG PO TABS: 650.0000 mg | ORAL_TABLET | ORAL | Status: DC | PRN

## 2016-12-01 MED ORDER — EPHEDRINE 5 MG/ML INJ: 10.0000 mg | INTRAVENOUS | Status: DC | PRN

## 2016-12-01 MED ORDER — FENTANYL CITRATE (PF) 100 MCG/2ML IJ SOLN
50.0000 ug | INTRAMUSCULAR | Status: DC | PRN
  Administered 2016-12-01 (×2): 100 ug via INTRAVENOUS
  Filled 2016-12-01 (×2): qty 2

## 2016-12-01 MED ORDER — EPHEDRINE 5 MG/ML INJ
10.0000 mg | INTRAVENOUS | Status: DC | PRN
  Filled 2016-12-01: qty 2

## 2016-12-01 MED ORDER — SIMETHICONE 80 MG PO CHEW: 80.0000 mg | CHEWABLE_TABLET | ORAL | Status: DC | PRN

## 2016-12-01 MED ORDER — LACTATED RINGERS IV SOLN
INTRAVENOUS | Status: DC
  Administered 2016-12-01 (×3): via INTRAVENOUS

## 2016-12-01 MED ORDER — ONDANSETRON HCL 4 MG/2ML IJ SOLN: 4.0000 mg | INTRAMUSCULAR | Status: DC | PRN

## 2016-12-01 MED ORDER — TETANUS-DIPHTH-ACELL PERTUSSIS 5-2.5-18.5 LF-MCG/0.5 IM SUSP: 0.5000 mL | Freq: Once | INTRAMUSCULAR | Status: DC

## 2016-12-01 MED ORDER — OXYTOCIN BOLUS FROM INFUSION: 500.0000 mL | Freq: Once | INTRAVENOUS | Status: DC

## 2016-12-01 MED ORDER — DIBUCAINE 1 % RE OINT: 1.0000 "application " | TOPICAL_OINTMENT | RECTAL | Status: DC | PRN

## 2016-12-01 MED ORDER — PHENYLEPHRINE 40 MCG/ML (10ML) SYRINGE FOR IV PUSH (FOR BLOOD PRESSURE SUPPORT)
80.0000 ug | PREFILLED_SYRINGE | INTRAVENOUS | Status: DC | PRN
  Filled 2016-12-01: qty 10
  Filled 2016-12-01: qty 5

## 2016-12-01 MED ORDER — IBUPROFEN 600 MG PO TABS
600.0000 mg | ORAL_TABLET | Freq: Four times a day (QID) | ORAL | Status: DC
  Administered 2016-12-01 – 2016-12-03 (×8): 600 mg via ORAL
  Filled 2016-12-01 (×8): qty 1

## 2016-12-01 MED ORDER — ONDANSETRON HCL 4 MG/2ML IJ SOLN: 4.0000 mg | Freq: Four times a day (QID) | INTRAMUSCULAR | Status: DC | PRN

## 2016-12-01 MED ORDER — OXYTOCIN 40 UNITS IN LACTATED RINGERS INFUSION - SIMPLE MED
1.0000 m[IU]/min | INTRAVENOUS | Status: DC
  Administered 2016-12-01: 2 m[IU]/min via INTRAVENOUS
  Filled 2016-12-01: qty 1000

## 2016-12-01 MED ORDER — PENICILLIN G POT IN DEXTROSE 60000 UNIT/ML IV SOLN
3.0000 10*6.[IU] | INTRAVENOUS | Status: DC
  Administered 2016-12-01: 3 10*6.[IU] via INTRAVENOUS
  Filled 2016-12-01 (×6): qty 50

## 2016-12-01 MED ORDER — LACTATED RINGERS IV SOLN: 500.0000 mL | INTRAVENOUS | Status: DC | PRN

## 2016-12-01 MED ORDER — DEXTROSE 5 % IV SOLN
5.0000 10*6.[IU] | Freq: Once | INTRAVENOUS | Status: AC
  Administered 2016-12-01: 5 10*6.[IU] via INTRAVENOUS
  Filled 2016-12-01: qty 5

## 2016-12-01 MED ORDER — LACTATED RINGERS IV SOLN: 500.0000 mL | Freq: Once | INTRAVENOUS | Status: DC

## 2016-12-01 MED ORDER — PHENYLEPHRINE 40 MCG/ML (10ML) SYRINGE FOR IV PUSH (FOR BLOOD PRESSURE SUPPORT): 80.0000 ug | PREFILLED_SYRINGE | INTRAVENOUS | Status: DC | PRN

## 2016-12-01 MED ORDER — LACTATED RINGERS IV SOLN
500.0000 mL | Freq: Once | INTRAVENOUS | Status: AC
  Administered 2016-12-01: 500 mL via INTRAVENOUS

## 2016-12-01 MED ORDER — DIPHENHYDRAMINE HCL 25 MG PO CAPS: 25.0000 mg | ORAL_CAPSULE | Freq: Four times a day (QID) | ORAL | Status: DC | PRN

## 2016-12-01 MED ORDER — WITCH HAZEL-GLYCERIN EX PADS: 1.0000 "application " | MEDICATED_PAD | CUTANEOUS | Status: DC | PRN

## 2016-12-01 MED ORDER — OXYCODONE-ACETAMINOPHEN 5-325 MG PO TABS: 2.0000 | ORAL_TABLET | ORAL | Status: DC | PRN

## 2016-12-01 MED ORDER — ONDANSETRON HCL 4 MG PO TABS: 4.0000 mg | ORAL_TABLET | ORAL | Status: DC | PRN

## 2016-12-01 MED ORDER — DIPHENHYDRAMINE HCL 50 MG/ML IJ SOLN: 12.5000 mg | INTRAMUSCULAR | Status: DC | PRN

## 2016-12-01 MED ORDER — MISOPROSTOL 25 MCG QUARTER TABLET
25.0000 ug | ORAL_TABLET | ORAL | Status: DC | PRN
  Administered 2016-12-01: 25 ug via VAGINAL
  Filled 2016-12-01 (×2): qty 1

## 2016-12-01 MED ORDER — ZOLPIDEM TARTRATE 5 MG PO TABS: 5.0000 mg | ORAL_TABLET | Freq: Every evening | ORAL | Status: DC | PRN

## 2016-12-01 MED ORDER — PRENATAL MULTIVITAMIN CH
1.0000 | ORAL_TABLET | Freq: Every day | ORAL | Status: DC
  Administered 2016-12-02 – 2016-12-03 (×2): 1 via ORAL
  Filled 2016-12-01 (×2): qty 1

## 2016-12-01 MED ORDER — OXYTOCIN 40 UNITS IN LACTATED RINGERS INFUSION - SIMPLE MED
2.5000 [IU]/h | INTRAVENOUS | Status: DC
  Administered 2016-12-01: 2.5 [IU]/h via INTRAVENOUS

## 2016-12-01 MED ORDER — LABETALOL HCL 100 MG PO TABS
100.0000 mg | ORAL_TABLET | Freq: Two times a day (BID) | ORAL | Status: DC
  Administered 2016-12-02 – 2016-12-03 (×3): 100 mg via ORAL
  Filled 2016-12-01 (×4): qty 1

## 2016-12-01 MED ORDER — SENNOSIDES-DOCUSATE SODIUM 8.6-50 MG PO TABS
2.0000 | ORAL_TABLET | ORAL | Status: DC
  Administered 2016-12-01 – 2016-12-02 (×2): 2 via ORAL
  Filled 2016-12-01 (×2): qty 2

## 2016-12-01 MED ORDER — PHENYLEPHRINE 40 MCG/ML (10ML) SYRINGE FOR IV PUSH (FOR BLOOD PRESSURE SUPPORT)
80.0000 ug | PREFILLED_SYRINGE | INTRAVENOUS | Status: DC | PRN
  Filled 2016-12-01: qty 5

## 2016-12-01 MED ORDER — PHENYLEPHRINE 40 MCG/ML (10ML) SYRINGE FOR IV PUSH (FOR BLOOD PRESSURE SUPPORT): 80.0000 ug | PREFILLED_SYRINGE | Freq: Once | INTRAVENOUS | Status: DC

## 2016-12-01 MED ORDER — COCONUT OIL OIL
1.0000 "application " | TOPICAL_OIL | Status: DC | PRN
  Administered 2016-12-02: 1 via TOPICAL
  Filled 2016-12-01: qty 120

## 2016-12-01 NOTE — Anesthesia Preprocedure Evaluation (Signed)
Anesthesia Evaluation  Patient identified by MRN, date of birth, ID band Patient awake    Reviewed: Allergy & Precautions, Patient's Chart, lab work & pertinent test results  Airway Mallampati: II  TM Distance: >3 FB     Dental   Pulmonary neg pulmonary ROS,    Pulmonary exam normal        Cardiovascular hypertension, Pt. on home beta blockers Normal cardiovascular exam     Neuro/Psych negative neurological ROS     GI/Hepatic negative GI ROS, Neg liver ROS,   Endo/Other  negative endocrine ROS  Renal/GU negative Renal ROS     Musculoskeletal   Abdominal   Peds  Hematology negative hematology ROS (+)   Anesthesia Other Findings   Reproductive/Obstetrics (+) Pregnancy (G1 for IOL 2/2 cHTN)                             Lab Results  Component Value Date   WBC 6.5 12/01/2016   HGB 11.8 (L) 12/01/2016   HCT 34.0 (L) 12/01/2016   MCV 92.1 12/01/2016   PLT 195 12/01/2016    Anesthesia Physical Anesthesia Plan  ASA: II  Anesthesia Plan: Epidural   Post-op Pain Management:    Induction:   Airway Management Planned: Natural Airway  Additional Equipment:   Intra-op Plan:   Post-operative Plan:   Informed Consent: I have reviewed the patients History and Physical, chart, labs and discussed the procedure including the risks, benefits and alternatives for the proposed anesthesia with the patient or authorized representative who has indicated his/her understanding and acceptance.     Plan Discussed with:   Anesthesia Plan Comments:         Anesthesia Quick Evaluation

## 2016-12-01 NOTE — H&P (Signed)
26 y.o. [redacted]w[redacted]d  G1P0 comes in for schedule IOL for CHTN >39weeks.  Otherwise has good fetal movement and no bleeding.  Past Medical History:  Diagnosis Date  . High cholesterol   . Hypertension     Past Surgical History:  Procedure Laterality Date  . ear abcess    . WISDOM TOOTH EXTRACTION      OB History  Gravida Para Term Preterm AB Living  1            SAB TAB Ectopic Multiple Live Births               # Outcome Date GA Lbr Len/2nd Weight Sex Delivery Anes PTL Lv  1 Current               Social History   Social History  . Marital status: Single    Spouse name: N/A  . Number of children: N/A  . Years of education: N/A   Occupational History  . Not on file.   Social History Main Topics  . Smoking status: Never Smoker  . Smokeless tobacco: Not on file  . Alcohol use Yes     Comment: occ  . Drug use: No  . Sexual activity: Yes    Birth control/ protection: Pill   Other Topics Concern  . Not on file   Social History Narrative  . No narrative on file   Sulfa antibiotics    Prenatal Transfer Tool  Maternal Diabetes: No Genetic Screening: Normal Maternal Ultrasounds/Referrals: Normal Fetal Ultrasounds or other Referrals:  None Maternal Substance Abuse:  Yes:  Type: Other: occ. EtOH Significant Maternal Medications:  None Significant Maternal Lab Results: Lab values include: Group B Strep positive  Other PNC: initial suspicion of short cervix, started on vaginal progesterone, self-resolved on future scans    Vitals:   12/01/16 0946 12/01/16 0947  BP: (!) 100/48 (!) 100/48  Pulse: 80 80  Resp:    Temp:       Lungs/Cor:  NAD Abdomen:  soft, gravid Ex:  no cords, erythema SVE:  1/40/-3 at admission, now 4cm FHTs:  135, good STV, 10x10s Toco:  q1-2   A/P   Admitted for IOL for CHTN  Spontaneously ruptured about 6:45am after 1 dose cytotec  Now on pitocin 2x2  GBS Pos - PCN  Epidural when desired  Zan Triska

## 2016-12-01 NOTE — Anesthesia Postprocedure Evaluation (Signed)
Anesthesia Post Note  Patient: Kristi Richmond  Procedure(s) Performed: * No procedures listed *  Patient location during evaluation: Mother Baby Anesthesia Type: Epidural Level of consciousness: awake and alert Pain management: satisfactory to patient Vital Signs Assessment: post-procedure vital signs reviewed and stable Respiratory status: respiratory function stable Cardiovascular status: stable Postop Assessment: no headache, no backache, epidural receding, patient able to bend at knees, no signs of nausea or vomiting and adequate PO intake Anesthetic complications: no        Last Vitals:  Vitals:   12/01/16 1727 12/01/16 1834  BP: 119/63 127/68  Pulse: 86 88  Resp: 18 18  Temp: 37.1 C 37.2 C    Last Pain:  Vitals:   12/01/16 1834  TempSrc: Oral  PainSc:    Pain Goal:                 Said Rueb

## 2016-12-01 NOTE — Lactation Note (Addendum)
This note was copied from a baby's chart. Lactation Consultation Note  Patient Name: Kristi Richmond [id number] Date: 12/01/2016 Reason for consult: Initial assessment  Initial visit at 7 hours of age.  Mom reports baby wont stay latched so she has been spoon feeding some. Baby asleep in crib, but then awakened crying.  Mom held baby and then baby burped with some feeding cues.  Baby did not latch. Lc allowed baby to suck on gloved finger.  Baby does not extend tongue past lower gumline.  Baby humped tongue in back on the mouth and tongue thrusts finger.  Mom reports just letting baby slurp off spoon baby has not been licking EBM off spoon.   Mom and FOB working on hand expression. LC teaching waking techniques. LC assisted with 1.41mls spoon feeding.  Drops of EBM placed on baby's tongue, baby does not extend for feeding. Baby would not take anymore at this feeding.   LC encouraged parents to feed with early cues on demand.  Allow STS for feeding attempts.  Mom to hand express and spoon feed as needed. LC encouraged suck training and massaging TMJ area of jaw.  Baby is not opening mouth wide.   Mom to request DEBP from bedside Rn later if baby isn't improving with latching.  Mom aware hand expressing will help with milk supply.  [facility] LC resources given and discussed.  Encouraged to feed with early cues on demand.  Early newborn behavior discussed.  Hand expression demonstrated by mom and FOB with colostrum visible.  Mom to call for assist as needed.     Maternal Data Has patient been taught Hand Expression?: Yes Does the patient have breastfeeding experience prior to this delivery?: No  Feeding Feeding Type: Breast Fed  LATCH Score/Interventions Latch: Too sleepy or reluctant, no latch achieved, no sucking elicited. Intervention(s): Assist with latch;Breast massage;Breast compression  Audible Swallowing: None  Type of Nipple: Everted at rest and after stimulation  Comfort (Breast/Nipple):  Soft / non-tender     Hold (Positioning): Assistance needed to correctly position infant at breast and maintain latch. Intervention(s): Breastfeeding basics reviewed;Support Pillows;Position options;Skin to skin  LATCH Score: 5  Lactation Tools Discussed/Used     Consult Status Consult Status: Follow-up Date: 12/02/16 Follow-up type: In-patient    Jannifer Rodney 12/01/2016, 10:24 PM

## 2016-12-01 NOTE — Anesthesia Procedure Notes (Signed)
Epidural Patient location during procedure: OB Start time: 12/01/2016 9:17 AM End time: 12/01/2016 9:27 AM  Staffing Anesthesiologist: Marcene Duos Performed: anesthesiologist   Preanesthetic Checklist Completed: patient identified, site marked, surgical consent, pre-op evaluation, timeout performed, IV checked, risks and benefits discussed and monitors and equipment checked  Epidural Patient position: sitting Prep: site prepped and draped and DuraPrep Patient monitoring: continuous pulse ox and blood pressure Approach: midline Location: L4-L5 Injection technique: LOR air  Needle:  Needle type: Tuohy  Needle gauge: 17 G Needle length: 9 cm and 9 Needle insertion depth: 7 cm Catheter type: closed end flexible Catheter size: 19 Gauge Catheter at skin depth: 13 (12-->13cm when laid in lat decub) cm Test dose: negative  Assessment Events: blood not aspirated, injection not painful, no injection resistance, negative IV test and no paresthesia

## 2016-12-01 NOTE — Anesthesia Pain Management Evaluation Note (Signed)
  CRNA Pain Management Visit Note  Patient: Kristi Richmond, 26 y.o., female  "Hello I am a member of the anesthesia team at [facility]. We have an anesthesia team available at all times to provide care throughout the hospital, including epidural management and anesthesia for C-section. I don't know your plan for the delivery whether it a natural birth, water birth, IV sedation, nitrous supplementation, doula or epidural, but we want to meet your pain goals."   1.Was your pain managed to your expectations on prior hospitalizations?   No prior hospitalizations  2.What is your expectation for pain management during this hospitalization?     Epidural  3.How can we help you reach that goal?MD at bedside to place epidural  Record the patient's initial score and the patient's pain goal.   Pain: 9  Pain Goal: 2 [facility] wants you to be able to say your pain was always managed very well.  Gemini Beaumier 12/01/2016

## 2016-12-02 LAB — CBC
HCT: 34.8 % — ABNORMAL LOW (ref 36.0–46.0)
HEMOGLOBIN: 12.1 g/dL (ref 12.0–15.0)
MCH: 32 pg (ref 26.0–34.0)
MCHC: 34.8 g/dL (ref 30.0–36.0)
MCV: 92.1 fL (ref 78.0–100.0)
Platelets: 197 10*3/uL (ref 150–400)
RBC: 3.78 MIL/uL — ABNORMAL LOW (ref 3.87–5.11)
RDW: 13.5 % (ref 11.5–15.5)
WBC: 11.3 10*3/uL — AB (ref 4.0–10.5)

## 2016-12-02 LAB — RPR: RPR Ser Ql: NONREACTIVE

## 2016-12-02 NOTE — Lactation Note (Signed)
This note was copied from a baby's chart. Lactation Consultation Note  Patient Name: Kristi Richmond [id number] Date: 12/02/2016   Multiple attempts (positioning, pre-pumping, placing ice near the nipple-areolar complex) were made to get "Kristi Richmond" to successfully latch to the bare breast as Mom wanted to try and not use a nipple shield. Kristi Richmond would latch, but not deeply & only for a few minutes. In addition, there was little sign of any transfer. The nipple shield was re-attempted & infant did latch for a longer period of time, but infant would come off the breast, fussy. There was no colostrum noted in nipple shield.   Mom assisted w/hand-expressing & quite a bit of colostrum was obtained. Infant was spoon-fed colostrum (finger-fed when too frantic). Infant calmed and was settling down.   Mom has my # to call for assist w/next feeding, if they desire.   When finger-feeding, infant was noted to have a normal palate & the suck was not dysfunctional.   Remigio Eisenmenger 12/02/2016, 8:34 PM

## 2016-12-02 NOTE — Lactation Note (Addendum)
This note was copied from a baby's chart. Lactation Consultation Note  Patient Name: Kristi Richmond [id number] Date: 12/02/2016 Reason for consult: Follow-up assessment    With this mom and term baby, now 25 hours old. The mom states the baby has been on and off the breast, and has difficulty latching deeply. Mom also states that her baby  had her first food feeding earlier this morning, where the baby fed for 40 minutes, latch was more comfortable, and she could see good breast movement. Baby has been passing a lot of flatus, and burping.  On exam of baby's mouth, she has a high palate, and a tongue that does not extend [past her gum line, and has a lingual frenulum placed almost anterior, but just past the tip of her tongue. The frenulum is not blanched, but pink, indicating it is not tight, but I do feel this is making it difficult for the baby to breastfeed. The baby also has what appears to be chapped lips, but is probably "cobblestoning", caused by the baby using her lips to hold onto the breast, since she can not make a seal with her tongue. The baby also swallows more air due to this.  I fitted mom with a 20 nipple shield, filled with some colostrum, and showed mom how to position the baby for cross cradle hold. The baby did not want to latch, was fussy, then spit up clear secretions, choked briefly, got dusky, but with position change, back patting, and bulb suction, she cleared her airway, pinked up, and was placed skin to skin with mom. The baby did burp, and pass flatus after this event. I told mom to call for lactation when Clydene Pugh is  next showing hunger cues.   I did give the parents oral restriction resouces, and told them to look up some site, to learn about this.  I will fax this note to Dr. Juanito Doom, pediatrician.   Maternal Data Formula Feeding for Exclusion: No  Feeding Feeding Type: Breast Fed Length of feed: 0 min  LATCH Score/Interventions Latch: Too sleepy or reluctant, no  latch achieved, no sucking elicited. (baby began spitting up clear mucous, choking, bulb syringe, dusky to pink, placed skin to skin with mom. ) Intervention(s): Skin to skin;Teach feeding cues;Waking techniques Intervention(s): Adjust position;Assist with latch (shoed mom cross cradle)  Audible Swallowing: None Intervention(s): Skin to skin;Hand expression (easily expressed colostrum. 1-2 ml's collected, placed in nipple shield, but lost when shield came off)  Type of Nipple: Everted at rest and after stimulation (short shaft, tried 20 nipple shield, but had trouble keeping shield in place)  Comfort (Breast/Nipple): Filling, red/small blisters or bruises, mild/mod discomfort  Problem noted: Mild/Moderate discomfort (pink nipples) Interventions (Mild/moderate discomfort): Post-pump  Hold (Positioning): Assistance needed to correctly position infant at breast and maintain latch. Intervention(s): Breastfeeding basics reviewed;Support Pillows;Position options;Skin to skin  LATCH Score: 4  Lactation Tools Discussed/Used Tools: Nipple Dorris Carnes;Flanges Nipple shield size: 20;24 (20 a good fit) Flange Size:  (tried 21 with good fit - mom knows to increase to 20 if niples enlarge when milk comes in) Pump Review: Setup, frequency, and cleaning;Milk Storage;Other (comment) (hand expression taught, pump settings) Initiated by:: Danton Clap, RN, IBCLC Date initiated:: 12/02/16   Consult Status Consult Status: Follow-up Date: 12/02/16 Follow-up type: In-patient    Alfred Levins 12/02/2016, 3:24 PM

## 2016-12-02 NOTE — Progress Notes (Signed)
Patient is doing well.  She is ambulating, voiding, tolerating PO.  Pain control is good.  Lochia is appropriate  Vitals:   12/01/16 1727 12/01/16 1834 12/01/16 2317 12/02/16 0609  BP: 119/63 127/68 (!) 103/58 139/75  Pulse: 86 88 68 69  Resp: Temp: 98.7 F (37.1 C) 98.9 F (37.2 C) 97.9 F (36.6 C) 98 F (36.7 C)  TempSrc: Oral Oral Oral   SpO2: 98% 99%    Weight:      Height:        NAD Fundus firm Ext: 1+ edema b/l  Lab Results  Component Value Date   WBC 11.3 (H) 12/02/2016   HGB 12.1 12/02/2016   HCT 34.8 (L) 12/02/2016   MCV 92.1 12/02/2016   PLT 197 12/02/2016    --/--/O POS, O POS (04/09 0055)/RImmune  A/P 25 y.o. G1P1001 PPD#1 s/p TSVD. Routine care.   CHTN--bps stable on labetalol  po bid Expect d/c tomorrow.    [facility] GEFFEL [employer]

## 2016-12-02 NOTE — Lactation Note (Signed)
This note was copied from a baby's chart. Lactation Consultation Note  Patient Name: Kristi Richmond [id number] Date: 12/02/2016    Mom has multiple visitors in room. She states infant fed recently. Mom has my # to call for assist w/next feeding.  Lurline Hare [facility] 12/02/2016, 6:44 PM

## 2016-12-03 MED ORDER — IBUPROFEN 600 MG PO TABS: 600.0000 mg | ORAL_TABLET | Freq: Four times a day (QID) | ORAL | 3 refills | Status: DC

## 2016-12-03 NOTE — Discharge Summary (Signed)
Obstetric Discharge Summary Reason for Admission: induction of labor Prenatal Procedures: NST and ultrasound Intrapartum Procedures: spontaneous vaginal delivery Postpartum Procedures: none Complications-Operative and Postpartum: none Hemoglobin  Date Value Ref Range Status  12/02/2016 12.1 12.0 - 15.0 g/dL Final   HCT  Date Value Ref Range Status  12/02/2016 34.8 (L) 36.0 - 46.0 % Final    Physical Exam:  General: alert, cooperative and no distress Lochia: appropriate Uterine Fundus: firm perineum: healing well, no significant drainage, no dehiscence, no significant erythema DVT Evaluation: No evidence of DVT seen on physical exam. Negative Homan's sign. No cords or calf tenderness. No significant calf/ankle edema.  Discharge Diagnoses: Term Pregnancy-delivered and Chronic hypertension  Discharge Information: Date: 12/03/2016 Activity: pelvic rest Diet: routine Medications: PNV and Ibuprofen Condition: stable Instructions: refer to practice specific booklet Discharge to: home   Newborn Data: Live born female  Birth Weight: 6 lb 6.8 oz (2914 g) APGAR: 8, 9  Home with mother.  Kristi Richmond Kristi Richmond 12/03/2016, 10:11 AM

## 2016-12-03 NOTE — Lactation Note (Signed)
This note was copied from a baby's chart. Lactation Consultation Note  Patient Name: Kristi Richmond [id number] Date: 12/03/2016 Reason for consult: Follow-up assessment  Baby 43 hours old. Mom reports that the baby has been fussy at the breast and she suckles off-and-on. Mom reports that she is not using the NS because the baby does not nurse well with and mom does not want to use. Assisted mom to latch and baby latches well, but no swallows noted and mom's nipple pinched slightly when the baby came off. Mom reports that this just started happening this morning, but she is not having any nipple discomfort. Assisted mom to hand express with some drops of colostrum flowing, which were given to the baby with spoon and finger. Mom states that she has not been using DEBP, so enc mom to pump and hand express, and mom reports that she has a personal DEBP.  Mom able to pump several ml of EBM, but reports that she lost most of it when she removed the flanges from her breasts. Discussed how to maintain a seal with flanges when pumping. Mom hand expressed an additional 2 ml of EBM after pumping and reports that her milk is flowing much more easily now. Assisted parents with application of 5 French feeding system at breast, but baby sleepy. So demonstrated to parents how to supplement using SNS and finger. Plan is for mom to put baby to breast with cues, then supplement with EBM--and/or formula if needed--according to supplementation guidelines, which were given with review. Enc mom to post-pump followed by hand expression. Discussed EBM storage guidelines.   Discussed the progression of breast milk coming to volume and enc mom to call for assistance if baby not latching and nursing well after milk volumes increase. Mom has formula in case mom's volumes not increasing. Mom aware of OP/BFSG and LC phone line assistance after D/C.   Maternal Data    Feeding Feeding Type: Breast Fed Length of feed: 3 min  LATCH  Score/Interventions Latch: Grasps breast easily, tongue down, lips flanged, rhythmical sucking. Intervention(s): Adjust position;Assist with latch;Breast compression  Audible Swallowing: A few with stimulation Intervention(s): Skin to skin;Hand expression  Type of Nipple: Everted at rest and after stimulation  Comfort (Breast/Nipple): Soft / non-tender     Hold (Positioning): Assistance needed to correctly position infant at breast and maintain latch. Intervention(s): Support Pillows  LATCH Score: 8  Lactation Tools Discussed/Used     Consult Status      Kristi Richmond 12/03/2016, 9:49 AM

## 2016-12-03 NOTE — Progress Notes (Signed)
Post Partum Day 2 Subjective: no complaints, up ad lib, voiding, tolerating PO, + flatus and breast feeding. Mother and baby are bonding well.  Objective: Blood pressure 124/72, pulse 75, temperature 98.1 F (36.7 C), temperature source Oral, resp. rate 18, height  (1.676 m), weight 84.8 kg (187 lb), SpO2 100 %, unknown if currently breastfeeding.  Physical Exam:  General: alert, cooperative and no distress Lochia: appropriate Uterine Fundus: firm perineum: healing well, no significant drainage, no dehiscence, no significant erythema DVT Evaluation: No evidence of DVT seen on physical exam. Negative Homan's sign. No cords or calf tenderness. No significant calf/ankle edema.   Recent Labs  12/01/16 0801 12/02/16 0523  HGB 11.8* 12.1  HCT 34.0* 34.8*    Assessment/Plan: Discharge home, Breastfeeding and Contraception will discuss at post partum visit   LOS: 2 days   Antanasia Kaczynski STACIA 12/03/2016, 10:08 AM

## 2017-03-25 DIAGNOSIS — K8020 Calculus of gallbladder without cholecystitis without obstruction: Secondary | ICD-10-CM

## 2017-03-25 DIAGNOSIS — K811 Chronic cholecystitis: Secondary | ICD-10-CM

## 2017-03-25 HISTORY — DX: Chronic cholecystitis: K81.1

## 2017-03-25 HISTORY — DX: Calculus of gallbladder without cholecystitis without obstruction: K80.20

## 2017-04-07 ENCOUNTER — Encounter (HOSPITAL_COMMUNITY): Payer: Self-pay | Admitting: Emergency Medicine

## 2017-04-07 ENCOUNTER — Emergency Department (HOSPITAL_COMMUNITY): Payer: 59

## 2017-04-07 ENCOUNTER — Emergency Department (HOSPITAL_COMMUNITY)
Admission: EM | Admit: 2017-04-07 | Discharge: 2017-04-07 | Disposition: A | Discharge status: Home / Self Care | Payer: 59 | Attending: Physician Assistant | Admitting: Physician Assistant

## 2017-04-07 DIAGNOSIS — I10 Essential (primary) hypertension: Secondary | ICD-10-CM | POA: Diagnosis not present

## 2017-04-07 DIAGNOSIS — Z79899 Other long term (current) drug therapy: Secondary | ICD-10-CM | POA: Diagnosis not present

## 2017-04-07 DIAGNOSIS — R1011 Right upper quadrant pain: Secondary | ICD-10-CM | POA: Diagnosis present

## 2017-04-07 DIAGNOSIS — K8020 Calculus of gallbladder without cholecystitis without obstruction: Secondary | ICD-10-CM | POA: Diagnosis not present

## 2017-04-07 DIAGNOSIS — Z791 Long term (current) use of non-steroidal anti-inflammatories (NSAID): Secondary | ICD-10-CM | POA: Insufficient documentation

## 2017-04-07 LAB — COMPREHENSIVE METABOLIC PANEL
ALBUMIN: 4.3 g/dL (ref 3.5–5.0)
ALK PHOS: 87 U/L (ref 38–126)
ALT: 43 U/L (ref 14–54)
AST: 87 U/L — ABNORMAL HIGH (ref 15–41)
Anion gap: 9 (ref 5–15)
BUN: 10 mg/dL (ref 6–20)
CALCIUM: 9.7 mg/dL (ref 8.9–10.3)
CO2: 25 mmol/L (ref 22–32)
CREATININE: 0.78 mg/dL (ref 0.44–1.00)
Chloride: 104 mmol/L (ref 101–111)
GFR calc non Af Amer: >60 mL/min (ref >60)
GLUCOSE: 98 mg/dL (ref 65–99)
Potassium: 4.1 mmol/L (ref 3.5–5.1)
SODIUM: 138 mmol/L (ref 135–145)
Total Bilirubin: 0.6 mg/dL (ref 0.3–1.2)
Total Protein: 7.4 g/dL (ref 6.5–8.1)

## 2017-04-07 LAB — CBC
HCT: 36.9 % (ref 36.0–46.0)
Hemoglobin: 13 g/dL (ref 12.0–15.0)
MCH: 31 pg (ref 26.0–34.0)
MCHC: 35.2 g/dL (ref 30.0–36.0)
MCV: 87.9 fL (ref 78.0–100.0)
PLATELETS: 296 10*3/uL (ref 150–400)
RBC: 4.2 MIL/uL (ref 3.87–5.11)
RDW: 12.4 % (ref 11.5–15.5)
WBC: 6.7 10*3/uL (ref 4.0–10.5)

## 2017-04-07 LAB — URINALYSIS, ROUTINE W REFLEX MICROSCOPIC
BILIRUBIN URINE: NEGATIVE
Glucose, UA: NEGATIVE mg/dL
HGB URINE DIPSTICK: NEGATIVE
Ketones, ur: NEGATIVE mg/dL
Leukocytes, UA: NEGATIVE
Nitrite: NEGATIVE
PROTEIN: NEGATIVE mg/dL
Specific Gravity, Urine: 1.019 (ref 1.005–1.030)
pH: 7 (ref 5.0–8.0)

## 2017-04-07 LAB — LIPASE, BLOOD: Lipase: 28 U/L (ref 11–51)

## 2017-04-07 LAB — POC URINE PREG, ED: PREG TEST UR: NEGATIVE

## 2017-04-07 NOTE — ED Notes (Signed)
Per US, pt to be transported for scan in approx 15 minutes. Transport on the way for pt.

## 2017-04-07 NOTE — ED Notes (Signed)
Pt verbalized understanding discharge instructions and denies any further needs or questions at this time. VS stable, ambulatory and steady gait.   

## 2017-04-07 NOTE — ED Provider Notes (Signed)
[facility]-EMER[facility]ENCY DEPT Provider Note   CSN: [id number] Arrival date & time: 04/07/17  1302     History   Chief Complaint Chief Complaint  Patient presents with  . Abdominal Pain    HPI Kristi Richmond is a 26 y.o. female.  HPI  26 year old female presents today with complaints of right upper quadrant abdominal pain. Patient reports approximately 2 months ago she had similar episode of pain in her right upper quadrant after eating. She notes since then she has not had any pain. Patient notes earlier today she had spaghetti followed by severe pain in the right upper quadrant. She notes at the time of evaluation she is no longer having pain. She denies any nausea or vomiting, denies any lower abdominal pain, fever or chills. No medications prior to arrival. Patient is currently breast-feeding.   Past Medical History:  Diagnosis Date  . High cholesterol   . Hypertension     Patient Active Problem List   Diagnosis Date Noted  . Chronic benign essential hypertension, antepartum 12/01/2016    Past Surgical History:  Procedure Laterality Date  . ear abcess    . WISDOM TOOTH EXTRACTION      OB History    Gravida Para Term Preterm AB Living   1 1 1     1    SAB TAB Ectopic Multiple Live Births         0 1       Home Medications    Prior to Admission medications   Medication Sig Start Date End Date Taking? Authorizing Provider  ibuprofen (ADVIL,MOTRIN) 600 MG tablet Take 1 tablet (600 mg total) by mouth every 6 (six) hours. Patient taking differently: Take 600 mg by mouth every 6 (six) hours as needed (for pain).  12/03/16  Yes Essie Hart, MD  labetalol (NORMODYNE) 100 MG tablet Take 100 mg by mouth 2 (two) times daily.   Yes [provider]  norethindrone (JENCYCLA) 0.35 MG tablet Take 1 tablet by mouth daily. 01/20/17  Yes [provider]  Prenatal Vit-Fe Fumarate-FA (PRENATAL MULTIVITAMIN) TABS tablet Take 1 tablet by mouth daily.    Yes [provider]    Family History Family History  Problem Relation Age of Onset  . Asthma Brother   . Alcohol abuse Neg Hx   . Arthritis Neg Hx   . Birth defects Neg Hx   . Cancer Neg Hx   . COPD Neg Hx   . Depression Neg Hx   . Diabetes Neg Hx   . Drug abuse Neg Hx     Social History Social History  Substance Use Topics  . Smoking status: Never Smoker  . Smokeless tobacco: Never Used  . Alcohol use Yes     Comment: occ     Allergies   Sulfa antibiotics   Review of Systems Review of Systems  All other systems reviewed and are negative.    Physical Exam Updated Vital Signs BP 122/74   Pulse 86   Temp 97.9 F (36.6 C) (Oral)   Resp 19   Ht 5' 5.5" (1.664 m)   Wt 70.3 kg (155 lb)   SpO2 100%   Breastfeeding? Yes   BMI 25.40 kg/m   Physical Exam  Constitutional: She is oriented to person, place, and time. She appears well-developed and well-nourished.  HENT:  Head: Normocephalic and atraumatic.  Eyes: Pupils are equal, round, and reactive to light. Conjunctivae are normal. Right eye exhibits no discharge. Left eye exhibits  no discharge. No scleral icterus.  Neck: Normal range of motion. No JVD present. No tracheal deviation present.  Pulmonary/Chest: Effort normal. No stridor.  Abdominal:  Minor tenderness to palpation of right upper quadrant- remainder of abdominal exam benign  Neurological: She is alert and oriented to person, place, and time. Coordination normal.  Psychiatric: She has a normal mood and affect. Her behavior is normal. Judgment and thought content normal.  Nursing note and vitals reviewed.    ED Treatments / Results  Labs (all labs ordered are listed, but only abnormal results are displayed) Labs Reviewed  COMPREHENSIVE METABOLIC PANEL - Abnormal; Notable for the following:       Result Value   AST 87 (*)    All other components within normal limits  LIPASE, BLOOD  CBC  URINALYSIS, ROUTINE W REFLEX MICROSCOPIC  POC URINE PREG,  ED    EKG  EKG Interpretation None       Radiology Koreas Abdomen Limited Ruq  Result Date: 04/07/2017 CLINICAL DATA:  Right upper quadrant pain EXAM: ULTRASOUND ABDOMEN LIMITED RIGHT UPPER QUADRANT COMPARISON:  None. FINDINGS: Gallbladder: Multiple small stones, the largest 2 mm. No wall thickening or sonographic Murphy sign. Gallbladder is contracted. The patient was not NPO prior to the study. Common bile duct: Diameter: Normal caliber, 4 mm Liver: No focal lesion identified. Within normal limits in parenchymal echogenicity. IMPRESSION: Several small 2 mm or smaller gallstones within the gallbladder. No evidence of acute cholecystitis. Electronically Signed   By: Charlett NoseKevin  Dover M.D.   On: 04/07/2017 18:46    Procedures Procedures (including critical care time)  Medications Ordered in ED Medications - No data to display   Initial Impression / Assessment and Plan / ED Course  I have reviewed the triage vital signs and the nursing notes.  Pertinent labs & imaging results that were available during my care of the patient were reviewed by me and considered in my medical decision making (see chart for details).      Final Clinical Impressions(s) / ED Diagnoses   Final diagnoses:  RUQ abdominal pain  Calculus of gallbladder without cholecystitis without obstruction    26 year old female presents today with likely Coley lithiasis. Patient has been asymptomatic throughout her stay in the ED. She has reassuring laboratory analysis with no signs of infection or obstruction. Gen. surgery evaluated the patient here in the ED. Patient will be discharged home with close follow-up with Gen. surgery. Patient refused any pain medication at home and she is breast-feeding and does not want to take anything. I encouraged her to return immediately if any new or worsening signs or symptoms present, follow up with general surgery. Both patient and her mother verbalized understanding and agreement to  today's plan had no further questions or concerns at the time discharge   New Prescriptions Discharge Medication List as of 04/07/2017  7:01 PM       Eyvonne MechanicHedges, Jaun Galluzzo, PA-C 04/07/17 1945    Abelino DerrickMackuen, Courteney Lyn, MD 04/15/17 1807

## 2017-04-07 NOTE — ED Triage Notes (Signed)
Pt reports acute RUQ abd pain after eating lunch today, endorses nausea, denies diarrhea, fever, chills.  Pt guarding at this time.

## 2017-04-07 NOTE — Discharge Instructions (Signed)
Please read attached information. If you experience any new or worsening signs or symptoms please return to the emergency room for evaluation. Please follow-up with your primary care provider or specialist as discussed.  °

## 2017-04-13 ENCOUNTER — Encounter (HOSPITAL_BASED_OUTPATIENT_CLINIC_OR_DEPARTMENT_OTHER): Payer: Self-pay | Admitting: *Deleted

## 2017-04-16 ENCOUNTER — Ambulatory Visit (HOSPITAL_COMMUNITY): Payer: 59

## 2017-04-16 ENCOUNTER — Ambulatory Visit (HOSPITAL_BASED_OUTPATIENT_CLINIC_OR_DEPARTMENT_OTHER)
Admission: RE | Admit: 2017-04-16 | Discharge: 2017-04-17 | Disposition: A | Discharge status: Home / Self Care | Payer: 59 | Source: Ambulatory Visit | Attending: Surgery | Admitting: Surgery

## 2017-04-16 ENCOUNTER — Encounter (HOSPITAL_BASED_OUTPATIENT_CLINIC_OR_DEPARTMENT_OTHER): Payer: Self-pay | Admitting: Registered Nurse

## 2017-04-16 ENCOUNTER — Encounter (HOSPITAL_BASED_OUTPATIENT_CLINIC_OR_DEPARTMENT_OTHER)
Admission: RE | Disposition: A | Discharge status: Home / Self Care | Payer: Self-pay | Source: Ambulatory Visit | Attending: Surgery

## 2017-04-16 ENCOUNTER — Encounter (HOSPITAL_COMMUNITY): Payer: Self-pay | Admitting: Anesthesiology

## 2017-04-16 ENCOUNTER — Encounter (HOSPITAL_BASED_OUTPATIENT_CLINIC_OR_DEPARTMENT_OTHER): Payer: Self-pay | Admitting: Surgery

## 2017-04-16 ENCOUNTER — Encounter: Payer: Self-pay | Admitting: Surgery

## 2017-04-16 ENCOUNTER — Other Ambulatory Visit: Payer: Self-pay | Admitting: Gastroenterology

## 2017-04-16 ENCOUNTER — Ambulatory Visit: Payer: Self-pay | Admitting: Surgery

## 2017-04-16 ENCOUNTER — Ambulatory Visit (HOSPITAL_BASED_OUTPATIENT_CLINIC_OR_DEPARTMENT_OTHER): Payer: 59 | Admitting: Anesthesiology

## 2017-04-16 DIAGNOSIS — K8065 Calculus of gallbladder and bile duct with chronic cholecystitis with obstruction: Secondary | ICD-10-CM | POA: Diagnosis not present

## 2017-04-16 DIAGNOSIS — I10 Essential (primary) hypertension: Secondary | ICD-10-CM | POA: Insufficient documentation

## 2017-04-16 DIAGNOSIS — N926 Irregular menstruation, unspecified: Secondary | ICD-10-CM | POA: Insufficient documentation

## 2017-04-16 DIAGNOSIS — Z79899 Other long term (current) drug therapy: Secondary | ICD-10-CM | POA: Insufficient documentation

## 2017-04-16 DIAGNOSIS — Z8349 Family history of other endocrine, nutritional and metabolic diseases: Secondary | ICD-10-CM | POA: Insufficient documentation

## 2017-04-16 DIAGNOSIS — R7989 Other specified abnormal findings of blood chemistry: Secondary | ICD-10-CM | POA: Diagnosis not present

## 2017-04-16 DIAGNOSIS — Z882 Allergy status to sulfonamides status: Secondary | ICD-10-CM | POA: Insufficient documentation

## 2017-04-16 DIAGNOSIS — Z8249 Family history of ischemic heart disease and other diseases of the circulatory system: Secondary | ICD-10-CM | POA: Diagnosis not present

## 2017-04-16 DIAGNOSIS — K8020 Calculus of gallbladder without cholecystitis without obstruction: Secondary | ICD-10-CM

## 2017-04-16 DIAGNOSIS — K8010 Calculus of gallbladder with chronic cholecystitis without obstruction: Secondary | ICD-10-CM | POA: Diagnosis present

## 2017-04-16 DIAGNOSIS — Z833 Family history of diabetes mellitus: Secondary | ICD-10-CM | POA: Insufficient documentation

## 2017-04-16 HISTORY — DX: Chronic cholecystitis: K81.1

## 2017-04-16 HISTORY — PX: CHOLECYSTECTOMY: SHX55

## 2017-04-16 HISTORY — DX: Calculus of gallbladder without cholecystitis without obstruction: K80.20

## 2017-04-16 SURGERY — LAPAROSCOPIC CHOLECYSTECTOMY WITH INTRAOPERATIVE CHOLANGIOGRAM
Anesthesia: General | Site: Abdomen

## 2017-04-16 MED ORDER — FENTANYL CITRATE (PF) 100 MCG/2ML IJ SOLN: 50.0000 ug | INTRAMUSCULAR | Status: DC | PRN

## 2017-04-16 MED ORDER — ONDANSETRON HCL 4 MG/2ML IJ SOLN
INTRAMUSCULAR | Status: DC | PRN
  Administered 2017-04-16: 4 mg via INTRAVENOUS

## 2017-04-16 MED ORDER — SCOPOLAMINE 1 MG/3DAYS TD PT72
MEDICATED_PATCH | TRANSDERMAL | Status: AC
  Filled 2017-04-16: qty 1

## 2017-04-16 MED ORDER — ROCURONIUM BROMIDE 10 MG/ML (PF) SYRINGE
PREFILLED_SYRINGE | INTRAVENOUS | Status: AC
  Filled 2017-04-16: qty 5

## 2017-04-16 MED ORDER — PROPOFOL 10 MG/ML IV BOLUS
INTRAVENOUS | Status: AC
  Filled 2017-04-16: qty 20

## 2017-04-16 MED ORDER — GLYCOPYRROLATE 0.2 MG/ML IV SOSY
PREFILLED_SYRINGE | INTRAVENOUS | Status: DC | PRN
  Administered 2017-04-16: 0.6 mg via INTRAVENOUS

## 2017-04-16 MED ORDER — KCL IN DEXTROSE-NACL 20-5-0.45 MEQ/L-%-% IV SOLN
INTRAVENOUS | Status: DC
  Administered 2017-04-16: 15:00:00 via INTRAVENOUS
  Filled 2017-04-16: qty 1000

## 2017-04-16 MED ORDER — PROMETHAZINE HCL 25 MG/ML IJ SOLN
INTRAMUSCULAR | Status: AC
  Filled 2017-04-16: qty 1

## 2017-04-16 MED ORDER — FENTANYL CITRATE (PF) 100 MCG/2ML IJ SOLN
INTRAMUSCULAR | Status: AC
  Filled 2017-04-16: qty 2

## 2017-04-16 MED ORDER — HYDROMORPHONE HCL 1 MG/ML IJ SOLN: 1.0000 mg | INTRAMUSCULAR | Status: DC | PRN

## 2017-04-16 MED ORDER — FENTANYL CITRATE (PF) 100 MCG/2ML IJ SOLN
25.0000 ug | INTRAMUSCULAR | Status: DC | PRN
  Administered 2017-04-16: 25 ug via INTRAVENOUS

## 2017-04-16 MED ORDER — HYDROCODONE-ACETAMINOPHEN 5-325 MG PO TABS: 1.0000 | ORAL_TABLET | ORAL | 0 refills | Status: DC | PRN

## 2017-04-16 MED ORDER — LABETALOL HCL 100 MG PO TABS
100.0000 mg | ORAL_TABLET | Freq: Two times a day (BID) | ORAL | Status: DC
  Administered 2017-04-16 – 2017-04-17 (×2): 100 mg via ORAL

## 2017-04-16 MED ORDER — ROCURONIUM BROMIDE 10 MG/ML (PF) SYRINGE
PREFILLED_SYRINGE | INTRAVENOUS | Status: DC | PRN
  Administered 2017-04-16: 50 mg via INTRAVENOUS

## 2017-04-16 MED ORDER — ONDANSETRON 4 MG PO TBDP: 4.0000 mg | ORAL_TABLET | Freq: Four times a day (QID) | ORAL | Status: DC | PRN

## 2017-04-16 MED ORDER — BUPIVACAINE-EPINEPHRINE 0.25% -1:200000 IJ SOLN
INTRAMUSCULAR | Status: DC | PRN
  Administered 2017-04-16: 18 mL

## 2017-04-16 MED ORDER — PROMETHAZINE HCL 25 MG/ML IJ SOLN
6.2500 mg | INTRAMUSCULAR | Status: DC | PRN
  Administered 2017-04-16: 6.25 mg via INTRAVENOUS

## 2017-04-16 MED ORDER — LIDOCAINE 2% (20 MG/ML) 5 ML SYRINGE
INTRAMUSCULAR | Status: AC
  Filled 2017-04-16: qty 5

## 2017-04-16 MED ORDER — FENTANYL CITRATE (PF) 250 MCG/5ML IJ SOLN
INTRAMUSCULAR | Status: DC | PRN
  Administered 2017-04-16: 100 ug via INTRAVENOUS

## 2017-04-16 MED ORDER — CHLORHEXIDINE GLUCONATE CLOTH 2 % EX PADS: 6.0000 | MEDICATED_PAD | Freq: Once | CUTANEOUS | Status: DC

## 2017-04-16 MED ORDER — ONDANSETRON HCL 4 MG/2ML IJ SOLN
INTRAMUSCULAR | Status: AC
  Filled 2017-04-16: qty 2

## 2017-04-16 MED ORDER — MIDAZOLAM HCL 2 MG/2ML IJ SOLN: 1.0000 mg | INTRAMUSCULAR | Status: DC | PRN

## 2017-04-16 MED ORDER — TRAMADOL HCL 50 MG PO TABS: 50.0000 mg | ORAL_TABLET | Freq: Four times a day (QID) | ORAL | Status: DC | PRN

## 2017-04-16 MED ORDER — DEXAMETHASONE SODIUM PHOSPHATE 10 MG/ML IJ SOLN
INTRAMUSCULAR | Status: DC | PRN
  Administered 2017-04-16: 10 mg via INTRAVENOUS

## 2017-04-16 MED ORDER — LIDOCAINE 2% (20 MG/ML) 5 ML SYRINGE
INTRAMUSCULAR | Status: DC | PRN
  Administered 2017-04-16: 100 mg via INTRAVENOUS

## 2017-04-16 MED ORDER — ACETAMINOPHEN 650 MG RE SUPP: 650.0000 mg | Freq: Four times a day (QID) | RECTAL | Status: DC | PRN

## 2017-04-16 MED ORDER — CEFAZOLIN SODIUM-DEXTROSE 2-4 GM/100ML-% IV SOLN
INTRAVENOUS | Status: AC
  Filled 2017-04-16: qty 100

## 2017-04-16 MED ORDER — HYDROCODONE-ACETAMINOPHEN 5-325 MG PO TABS
1.0000 | ORAL_TABLET | ORAL | Status: DC | PRN
  Administered 2017-04-16: 1 via ORAL
  Filled 2017-04-16: qty 1

## 2017-04-16 MED ORDER — LACTATED RINGERS IV SOLN
INTRAVENOUS | Status: DC
  Administered 2017-04-16 (×3): via INTRAVENOUS

## 2017-04-16 MED ORDER — SODIUM CHLORIDE 0.9 % IR SOLN
Status: DC | PRN
  Administered 2017-04-16: 1

## 2017-04-16 MED ORDER — ACETAMINOPHEN 500 MG PO TABS
1000.0000 mg | ORAL_TABLET | Freq: Once | ORAL | Status: AC
  Administered 2017-04-16: 1000 mg via ORAL

## 2017-04-16 MED ORDER — CEFAZOLIN SODIUM-DEXTROSE 2-4 GM/100ML-% IV SOLN
2.0000 g | INTRAVENOUS | Status: AC
  Administered 2017-04-16: 2 g via INTRAVENOUS

## 2017-04-16 MED ORDER — SODIUM CHLORIDE 0.9 % IV SOLN
INTRAVENOUS | Status: DC | PRN
  Administered 2017-04-16: 40 mL

## 2017-04-16 MED ORDER — ACETAMINOPHEN 325 MG PO TABS: 650.0000 mg | ORAL_TABLET | Freq: Four times a day (QID) | ORAL | Status: DC | PRN

## 2017-04-16 MED ORDER — ONDANSETRON HCL 4 MG/2ML IJ SOLN: 4.0000 mg | Freq: Four times a day (QID) | INTRAMUSCULAR | Status: DC | PRN

## 2017-04-16 MED ORDER — NEOSTIGMINE METHYLSULFATE 5 MG/5ML IV SOSY
PREFILLED_SYRINGE | INTRAVENOUS | Status: AC
  Filled 2017-04-16: qty 5

## 2017-04-16 MED ORDER — MIDAZOLAM HCL 2 MG/2ML IJ SOLN
INTRAMUSCULAR | Status: AC
  Filled 2017-04-16: qty 2

## 2017-04-16 MED ORDER — PROPOFOL 10 MG/ML IV BOLUS
INTRAVENOUS | Status: DC | PRN
  Administered 2017-04-16: 200 mg via INTRAVENOUS

## 2017-04-16 MED ORDER — SCOPOLAMINE 1 MG/3DAYS TD PT72: 1.0000 | MEDICATED_PATCH | Freq: Once | TRANSDERMAL | Status: DC | PRN

## 2017-04-16 MED ORDER — MIDAZOLAM HCL 5 MG/5ML IJ SOLN
INTRAMUSCULAR | Status: DC | PRN
  Administered 2017-04-16: 2 mg via INTRAVENOUS

## 2017-04-16 MED ORDER — DEXAMETHASONE SODIUM PHOSPHATE 10 MG/ML IJ SOLN
INTRAMUSCULAR | Status: AC
  Filled 2017-04-16: qty 1

## 2017-04-16 MED ORDER — ACETAMINOPHEN 500 MG PO TABS
ORAL_TABLET | ORAL | Status: AC
  Filled 2017-04-16: qty 2

## 2017-04-16 MED ORDER — NEOSTIGMINE METHYLSULFATE 5 MG/5ML IV SOSY
PREFILLED_SYRINGE | INTRAVENOUS | Status: DC | PRN
  Administered 2017-04-16: 3 mg via INTRAVENOUS

## 2017-04-16 SURGICAL SUPPLY — 42 items
APPLIER CLIP ROT 10 11.4 M/L (STAPLE) ×3
APR CLP MED LRG 11.4X10 (STAPLE) ×1
BAG SPEC RTRVL LRG 6X4 10 (ENDOMECHANICALS) ×1
BLADE CLIPPER SURG (BLADE) IMPLANT
CANISTER SUCT 1200ML W/VALVE (MISCELLANEOUS) ×3 IMPLANT
CHLORAPREP W/TINT 26ML (MISCELLANEOUS) ×3 IMPLANT
CLIP APPLIE ROT 10 11.4 M/L (STAPLE) ×1 IMPLANT
CLOSURE WOUND 1/2 X4 (GAUZE/BANDAGES/DRESSINGS) ×1
COVER MAYO STAND STRL (DRAPES) ×3 IMPLANT
COVER SURGICAL LIGHT HANDLE (MISCELLANEOUS) ×3 IMPLANT
DRAPE C-ARM 42X72 X-RAY (DRAPES) ×3 IMPLANT
ELECT REM PT RETURN 9FT ADLT (ELECTROSURGICAL) ×3
ELECTRODE REM PT RTRN 9FT ADLT (ELECTROSURGICAL) ×1 IMPLANT
GLOVE BIOGEL PI IND STRL 7.0 (GLOVE) IMPLANT
GLOVE BIOGEL PI INDICATOR 7.0 (GLOVE) ×6
GLOVE ECLIPSE 6.5 STRL STRAW (GLOVE) ×2 IMPLANT
GLOVE SURG ORTHO 8.0 STRL STRW (GLOVE) ×3 IMPLANT
GOWN STRL REUS W/ TWL LRG LVL3 (GOWN DISPOSABLE) ×2 IMPLANT
GOWN STRL REUS W/ TWL XL LVL3 (GOWN DISPOSABLE) ×1 IMPLANT
GOWN STRL REUS W/TWL LRG LVL3 (GOWN DISPOSABLE) ×6
GOWN STRL REUS W/TWL XL LVL3 (GOWN DISPOSABLE) ×3
NS IRRIG 1000ML POUR BTL (IV SOLUTION) ×3 IMPLANT
PACK BASIN DAY SURGERY FS (CUSTOM PROCEDURE TRAY) ×3 IMPLANT
PAD ARMBOARD 7.5X6 YLW CONV (MISCELLANEOUS) ×3 IMPLANT
POUCH SPECIMEN RETRIEVAL 10MM (ENDOMECHANICALS) ×2 IMPLANT
SCISSORS LAP 5X35 DISP (ENDOMECHANICALS) ×3 IMPLANT
SET CHOLANGIOGRAPH 5 50 .035 (SET/KITS/TRAYS/PACK) ×3 IMPLANT
SET IRRIG TUBING LAPAROSCOPIC (IRRIGATION / IRRIGATOR) ×3 IMPLANT
SLEEVE ENDOPATH XCEL 5M (ENDOMECHANICALS) ×3 IMPLANT
SPONGE GAUZE 2X2 8PLY STER LF (GAUZE/BANDAGES/DRESSINGS) ×4
SPONGE GAUZE 2X2 8PLY STRL LF (GAUZE/BANDAGES/DRESSINGS) ×8 IMPLANT
STRIP CLOSURE SKIN 1/2X4 (GAUZE/BANDAGES/DRESSINGS) ×2 IMPLANT
SUT MNCRL AB 4-0 PS2 18 (SUTURE) ×3 IMPLANT
TOWEL OR 17X24 6PK STRL BLUE (TOWEL DISPOSABLE) ×3 IMPLANT
TOWEL OR NON WOVEN STRL DISP B (DISPOSABLE) ×3 IMPLANT
TRAY LAPAROSCOPIC (CUSTOM PROCEDURE TRAY) ×3 IMPLANT
TROCAR XCEL BLUNT TIP 100MML (ENDOMECHANICALS) ×3 IMPLANT
TROCAR XCEL NON-BLD 11X100MML (ENDOMECHANICALS) ×3 IMPLANT
TROCAR XCEL NON-BLD 5MMX100MML (ENDOMECHANICALS) ×3 IMPLANT
TUBE CONNECTING 20'X1/4 (TUBING) ×1
TUBE CONNECTING 20X1/4 (TUBING) ×2 IMPLANT
TUBING INSUFFLATION (TUBING) ×3 IMPLANT

## 2017-04-16 NOTE — H&P (Signed)
General Surgery [facility]  Kristi Richmond 04/08/2017 11:19 AM Location: [facility] Patient #: [id number] DOB: 1990-10-18 Single / Language: Undefined / Race: Black or African American Female   History of Present Illness Kristi Heckler MD; 04/08/2017 11:45 AM) The patient is a 26 year old female who presents for evaluation of gall stones.  CC: symptomatic cholelithiasis, abnormal LFT's  Patient is referred by the emergency department at [facility] for evaluation of symptomatic cholelithiasis and abnormal liver function tests. Patient's primary care physician is Dr. Lynn Ito at [facility]. Patient's gynecologist is Dr. Mora Appl at [facility]. Patient presents with a history of intermittent right upper quadrant abdominal pain associated with meals since May 2018. Patient had a severe episode yesterday following lunch. She noted right upper quadrant abdominal pain radiating to the back into the chest. This was associated with nausea. Patient was seen in the emergency department and underwent ultrasound examination showing multiple small gallstones with the largest stone measuring approximately 2 mm in size. There were no acute inflammatory changes. There is no biliary dilatation. Laboratory studies showed a mildly elevated AST level of 87. Other liver function tests were normal. Patient has had no prior abdominal surgery. She denies any history of jaundice or acholic stools. Patient denies any hepatic or pancreatic disease. There is no family history of gallbladder disease. Patient presents today to discuss cholecystectomy. She has 1 child born in April 2018 and is breast-feeding.   Past Surgical History Kristi Bickers, LPN; 9/31/1216 24:46 AM) Oral Surgery   Diagnostic Studies History Kristi Bickers, LPN; 9/50/7225 75:05 AM) Colonoscopy  never Mammogram  never Pap Smear  1-5 years ago  Allergies Kristi Bickers, LPN;  1/83/3582 11:26 AM) Sulfa Antibiotics  Hives. Allergies Reconciled   Medication History Kristi Bickers, LPN; 01/09/9841 10:31 AM) Labetalol HCl (100MG  Tablet, Oral) Active. Jencycla (0.35MG  Tablet, Oral) Active. Prenatal Formula (Oral) Active. Medications Reconciled  Social History Kristi Bickers, LPN; 2/81/1886 77:37 AM) Alcohol use  Recently quit alcohol use. Caffeine use  Carbonated beverages, Coffee, Tea. No drug use  Tobacco use  Never smoker.  Family History Kristi Bickers, LPN; 3/66/8159 47:07 AM) Diabetes Mellitus  Father. Hypertension  Mother. Thyroid problems  Mother.  Pregnancy / Birth History Kristi Bickers, LPN; 02/06/1833 37:35 AM) Age at menarche  11 years. Contraceptive History  Oral contraceptives. Gravida  1 Irregular periods  Length (months) of breastfeeding  3-6 Maternal age  67-30 Para  1  Other Problems Kristi Bickers, LPN; 7/89/7847 84:12 AM) High blood pressure     Review of Systems [name] LPN; 04/13/8137 87:19 AM) General Not Present- Appetite Loss, Chills, Fatigue, Fever, Night Sweats, Weight Gain and Weight Loss. Skin Not Present- Change in Wart/Mole, Dryness, Hives, Jaundice, New Lesions, Non-Healing Wounds, Rash and Ulcer. HEENT Not Present- Earache, Hearing Loss, Hoarseness, Nose Bleed, Oral Ulcers, Ringing in the Ears, Seasonal Allergies, Sinus Pain, Sore Throat, Visual Disturbances, Wears glasses/contact lenses and Yellow Eyes. Respiratory Not Present- Bloody sputum, Chronic Cough, Difficulty Breathing, Snoring and Wheezing. Breast Not Present- Breast Mass, Breast Pain, Nipple Discharge and Skin Changes. Cardiovascular Not Present- Chest Pain, Difficulty Breathing Lying Down, Leg Cramps, Palpitations, Rapid Heart Rate, Shortness of Breath and Swelling of Extremities. Gastrointestinal Present- Abdominal Pain and Excessive gas. Not Present- Bloating, Bloody Stool, Change in Bowel Habits, Chronic diarrhea, Constipation,  Difficulty Swallowing, Gets full quickly at meals, Hemorrhoids, Indigestion, Nausea, Rectal Pain and Vomiting. Female Genitourinary Not Present- Frequency, Nocturia, Painful Urination, Pelvic Pain  and Urgency. Musculoskeletal Not Present- Back Pain, Joint Pain, Joint Stiffness, Muscle Pain, Muscle Weakness and Swelling of Extremities. Neurological Not Present- Decreased Memory, Fainting, Headaches, Numbness, Seizures, Tingling, Tremor, Trouble walking and Weakness. Psychiatric Not Present- Anxiety, Bipolar, Change in Sleep Pattern, Depression, Fearful and Frequent crying. Endocrine Not Present- Cold Intolerance, Excessive Hunger, Hair Changes, Heat Intolerance, Hot flashes and New Diabetes. Hematology Not Present- Blood Thinners, Easy Bruising, Excessive bleeding, Gland problems, HIV and Persistent Infections.  Vitals [name] LPN; 1/91/4782 95:62 AM) 04/08/2017 11:25 AM Weight: 155.4 lb Height: 65.5in Body Surface Area: 1.79 m Body Mass Index: 25.47 kg/m  Temp.: 98.25F(Oral)  Pulse: 69 (Regular)  BP: 122/82 (Sitting, Left Arm, Standard)       Physical Exam Kristi Heckler MD; 04/08/2017 11:46 AM) The physical exam findings are as follows: Note:CONSTITUTIONAL See vital signs recorded above  GENERAL APPEARANCE Development: normal Nutritional status: normal Gross deformities: none  SKIN Rash, lesions, ulcers: none Induration, erythema: none Nodules: none palpable  EYES Conjunctiva and lids: normal Pupils: equal and reactive Iris: normal bilaterally  EARS, NOSE, MOUTH, THROAT External ears: no lesion or deformity External nose: no lesion or deformity Hearing: grossly normal Lips: no lesion or deformity Dentition: normal for age Oral mucosa: moist  NECK Symmetric: yes Trachea: midline Thyroid: no palpable nodules in the thyroid bed  CHEST Respiratory effort: normal Retraction or accessory muscle use: no Breath sounds: normal bilaterally Rales,  rhonchi, wheeze: none  CARDIOVASCULAR Auscultation: regular rhythm, normal rate Murmurs: none Pulses: carotid and radial pulse 2+ palpable Lower extremity edema: none Lower extremity varicosities: none  ABDOMEN Distension: none Masses: none palpable Tenderness: mild RUQ Hepatosplenomegaly: not present Hernia: not present  MUSCULOSKELETAL Station and gait: normal Digits and nails: no clubbing or cyanosis Muscle strength: grossly normal all extremities Range of motion: grossly normal all extremities Deformity: none  LYMPHATIC Cervical: none palpable Supraclavicular: none palpable  PSYCHIATRIC Oriented to person, place, and time: yes Mood and affect: normal for situation Judgment and insight: appropriate for situation    Assessment & Plan Kristi Heckler MD; 04/08/2017 11:49 AM) CHOLELITHIASIS WITH CHRONIC CHOLECYSTITIS (K80.10) Current Plans Patient presents with symptomatic cholelithiasis and intermittent episodes of biliary colic over the past 4 months. Patient is given written literature on gallbladder surgery to review at home.  I have recommended proceeding with laparoscopic cholecystectomy with intraoperative cholangiography. We have discussed the surgical procedure. We've discussed the hospital stay. We have discussed her postoperative recovery and return to work. We have discussed the possibility of stones in the common bile duct and their management. We have discussed her breast-feeding and discarding breast milk around the time of her surgical procedure. Patient understands and wishes to proceed with surgery in the near future.  The risks and benefits of the procedure have been discussed at length with the patient. The patient understands the proposed procedure, potential alternative treatments, and the course of recovery to be expected. All of the patient's questions have been answered at this time. The patient wishes to proceed with surgery.  Kristi Heckler, MD, [facility] Office: [phone]

## 2017-04-16 NOTE — Progress Notes (Signed)
Patient ID: Kristi Richmond, female   DOB: April 18, 1991, 26 y.o.   MRN: [id number]  General Surgery [facility]  Patient seen post op.  Doing well and tolerating diet.  Family in room.  Patient with distal CBD stone on cholangiogram today.  I have arranged for ERCP tomorrow by Dr. Vida Rigger.  He will see patient in the morning at [facility] and perform procedure.  Velora Heckler, MD, [facility] Office: [phone]

## 2017-04-16 NOTE — Progress Notes (Signed)
Spoke with Dr. Gerrit Friends by phone. Pt to be discharged from [facility] in am to [facility] for procedure by Dr. Ewing Schlein. Arrival to [facility] by 9am 04/17/17. To be discharged with saline lock in place. NPO after MN tonight for procedure per Dr. Gerrit Friends

## 2017-04-16 NOTE — Anesthesia Postprocedure Evaluation (Signed)
Anesthesia Post Note  Patient: Kristi Richmond  Procedure(s) Performed: Procedure(s) (LRB): LAPAROSCOPIC CHOLECYSTECTOMY WITH INTRAOPERATIVE CHOLANGIOGRAM (N/A)     Patient location during evaluation: PACU Anesthesia Type: General Level of consciousness: awake and alert Pain management: pain level controlled Vital Signs Assessment: post-procedure vital signs reviewed and stable Respiratory status: spontaneous breathing, nonlabored ventilation and respiratory function stable Cardiovascular status: blood pressure returned to baseline and stable Postop Assessment: no signs of nausea or vomiting Anesthetic complications: no    Last Vitals:  Vitals:   04/16/17 1400 04/16/17 1415  BP: 129/86 128/83  Pulse: 79 73  Resp: 15 13  Temp:    SpO2: 100% 99%    Last Pain:  Vitals:   04/16/17 1415  TempSrc:   PainSc: Asleep                 Cecile Hearing

## 2017-04-16 NOTE — Op Note (Signed)
Procedure Note  Pre-operative Diagnosis:  Chronic cholecystitis, cholelithiasis  Post-operative Diagnosis:  Same, distal CBD stone (choledocholithiasis)  Surgeon:  Velora Heckler, MD, FACS  Assistant:  none   Procedure:  Laparoscopic cholecystectomy with intra-operative cholangiography  Anesthesia:  General  Estimated Blood Loss:  minimal  Drains: none         Specimen: Gallbladder to pathology  Indications:  The patient is a 26 year old female who presents for evaluation of gall stones.  Patient is referred by the emergency department at [facility] for evaluation of symptomatic cholelithiasis and abnormal liver function tests. Patient's primary care physician is Dr. Lynn Ito at [facility]. Patient's gynecologist is Dr. Mora Appl at [facility]. Patient presents with a history of intermittent right upper quadrant abdominal pain associated with meals since May 2018. Patient had a severe episode yesterday following lunch. She noted right upper quadrant abdominal pain radiating to the back into the chest. This was associated with nausea. Patient was seen in the emergency department and underwent ultrasound examination showing multiple small gallstones with the largest stone measuring approximately 2 mm in size. There were no acute inflammatory changes. There is no biliary dilatation. Laboratory studies showed a mildly elevated AST level of 87. Other liver function tests were normal. Patient has had no prior abdominal surgery. She denies any history of jaundice or acholic stools. Patient denies any hepatic or pancreatic disease. There is no family history of gallbladder disease. Patient presents today to discuss cholecystectomy. She has 1 child born in April 2018 and is breast-feeding.   Procedure Details:  The patient was seen in the pre-op holding area. The risks, benefits, complications, treatment options, and expected outcomes were previously discussed with the  patient. The patient agreed with the proposed plan and has signed the informed consent form.  The patient was brought to the Operating Room, identified as Kristi Richmond and the procedure verified as laparoscopic cholecystectomy with intraoperative cholangiography. A "time out" was completed and the above information confirmed.  The patient was placed in the supine position. Following induction of general anesthesia, the abdomen was prepped and draped in the usual aseptic fashion.  An incision was made in the skin near the umbilicus. The midline fascia was incised and the peritoneal cavity was entered and a Hasson canula was introduced under direct vision.  The Hasson canula was secured with a 0-Vicryl pursestring suture. Pneumoperitoneum was established with carbon dioxide. Additional trocars were introduced under direct vision along the right costal margin in the midline, mid-clavicular line, and anterior axillary line.   The gallbladder was identified and the fundus grasped and retracted cephalad. Adhesions were taken down bluntly and the electrocautery was utilized as needed, taking care not to injure any adjacent structures. The infundibulum was grasped and retracted laterally, exposing the peritoneum overlying the triangle of Calot. The peritoneum was incised and structures exposed with blunt dissection. The cystic duct was clearly identified, bluntly dissected circumferentially, and clipped at the neck of the gallbladder.  An incision was made in the cystic duct and the cholangiogram catheter introduced. The catheter was secured using an ligaclip.  Real-time cholangiography was performed using C-arm fluoroscopy.  There was rapid filling of a normal caliber common bile duct.  There was reflux of contrast into the left and right hepatic ductal systems.  There was flow distally with limited filling of the duodenum.  There appears to be a stone in the distal common bile duct that is partially  obstructing to  flow.  The catheter was removed from the peritoneal cavity.  The cystic duct was then ligated with surgical clips and divided. The cystic artery was identified, dissected circumferentially, ligated with ligaclips, and divided.  The gallbladder was dissected away from the liver bed using the electrocautery for hemostasis. The gallbladder was completely removed from the liver and placed into an endocatch bag. The gallbladder was removed in the endocatch bag through the umbilical port site and submitted to pathology for review.  The right upper quadrant was irrigated and the gallbladder bed was inspected. Hemostasis was achieved with the electrocautery.  Pneumoperitoneum was released after viewing removal of the trocars with good hemostasis noted. The umbilical wound was irrigated and the fascia was then closed with the pursestring suture.  Local anesthetic was infiltrated at all port sites. The skin incisions were closed with 4-0 Monocril subcuticular sutures and steri-strips and dressings were applied.  Instrument, sponge, and needle counts were correct at the conclusion of the case.  The patient was awakened from anesthesia and brought to the recovery room in stable condition.  The patient tolerated the procedure well.   Velora Heckler, MD, [facility] Office: [phone]

## 2017-04-16 NOTE — Anesthesia Preprocedure Evaluation (Addendum)
Anesthesia Evaluation  Patient identified by MRN, date of birth, ID band Patient awake    Reviewed: Allergy & Precautions, NPO status , Patient's Chart, lab work & pertinent test results, reviewed documented beta blocker date and time   Airway Mallampati: II  TM Distance: >3 FB Neck ROM: Full    Dental  (+) Teeth Intact, Dental Advisory Given   Pulmonary neg pulmonary ROS,    Pulmonary exam normal breath sounds clear to auscultation       Cardiovascular hypertension, Pt. on home beta blockers Normal cardiovascular exam Rhythm:Regular Rate:Normal     Neuro/Psych negative neurological ROS  negative psych ROS   GI/Hepatic negative GI ROS, Chronic cholecystitis    Endo/Other  negative endocrine ROS  Renal/GU negative Renal ROS     Musculoskeletal negative musculoskeletal ROS (+)   Abdominal   Peds  Hematology negative hematology ROS (+)   Anesthesia Other Findings Day of surgery medications reviewed with the patient.  Reproductive/Obstetrics OCP use                            Anesthesia Physical Anesthesia Plan  ASA: I  Anesthesia Plan: General   Post-op Pain Management:    Induction: Intravenous  PONV Risk Score and Plan: 3 and Ondansetron, Dexamethasone and Midazolam  Airway Management Planned: Oral ETT  Additional Equipment:   Intra-op Plan:   Post-operative Plan: Extubation in OR  Informed Consent: I have reviewed the patients History and Physical, chart, labs and discussed the procedure including the risks, benefits and alternatives for the proposed anesthesia with the patient or authorized representative who has indicated his/her understanding and acceptance.   Dental advisory given  Plan Discussed with: CRNA  Anesthesia Plan Comments: (Risks/benefits of general anesthesia discussed with patient including risk of damage to teeth, lips, gum, and tongue, nausea/vomiting,  allergic reactions to medications, and the possibility of heart attack, stroke and death.  All patient questions answered.  Patient wishes to proceed.)        Anesthesia Quick Evaluation

## 2017-04-16 NOTE — Interval H&P Note (Signed)
History and Physical Interval Note:  04/16/2017 10:40 AM  Kristi Richmond  has presented today for surgery, with the diagnosis of chronic cholecystitis, cholelithiasis  The various methods of treatment have been discussed with the patient and family. After consideration of risks, benefits and other options for treatment, the patient has consented to    Procedure(s): LAPAROSCOPIC CHOLECYSTECTOMY WITH INTRAOPERATIVE CHOLANGIOGRAM (N/A) as a surgical intervention .    The patient's history has been reviewed, patient examined, no change in status, stable for surgery.  I have reviewed the patient's chart and labs.  Questions were answered to the patient's satisfaction.    Velora Heckler, MD, [facility] Office: [phone]   Eoin Willden Judie Petit

## 2017-04-16 NOTE — Anesthesia Procedure Notes (Signed)
Procedure Name: Intubation Date/Time: 04/16/2017 11:02 AM Performed by: Jhonnie Garner Pre-anesthesia Checklist: Patient identified, Emergency Drugs available, Suction available and Patient being monitored Patient Re-evaluated:Patient Re-evaluated prior to induction Oxygen Delivery Method: Circle system utilized Preoxygenation: Pre-oxygenation with 100% oxygen Induction Type: IV induction Ventilation: Mask ventilation without difficulty Laryngoscope Size: Miller and 2 Grade View: Grade II Tube type: Oral Tube size: 7.0 mm Number of attempts: 1 Airway Equipment and Method: Stylet Placement Confirmation: ETT inserted through vocal cords under direct vision,  positive ETCO2 and breath sounds checked- equal and bilateral Secured at: 21 cm Tube secured with: Tape Dental Injury: Teeth and Oropharynx as per pre-operative assessment

## 2017-04-16 NOTE — Transfer of Care (Signed)
Immediate Anesthesia Transfer of Care Note  Patient: DELESA GREIFF  Procedure(s) Performed: Procedure(s): LAPAROSCOPIC CHOLECYSTECTOMY WITH INTRAOPERATIVE CHOLANGIOGRAM (N/A)  Patient Location: PACU  Anesthesia Type:General  Level of Consciousness:  sedated, patient cooperative and responds to stimulation  Airway & Oxygen Therapy:Patient Spontanous Breathing and Patient connected to face mask oxgen  Post-op Assessment:  Report given to PACU RN and Post -op Vital signs reviewed and stable  Post vital signs:  Reviewed and stable  Last Vitals:  Vitals:   04/16/17 1012 04/16/17 1207  BP: 128/83 (!) 135/94  Pulse: 75 76  Resp: 18 14  Temp: 36.8 C 36.7 C  SpO2: 100% 100%    Complications: No apparent anesthesia complications

## 2017-04-17 ENCOUNTER — Ambulatory Visit (HOSPITAL_COMMUNITY): Payer: 59

## 2017-04-17 ENCOUNTER — Ambulatory Visit (HOSPITAL_COMMUNITY): Admission: RE | Admit: 2017-04-17 | Payer: 59 | Source: Ambulatory Visit | Admitting: Gastroenterology

## 2017-04-17 ENCOUNTER — Ambulatory Visit (HOSPITAL_COMMUNITY): Payer: 59 | Admitting: Anesthesiology

## 2017-04-17 ENCOUNTER — Encounter (HOSPITAL_COMMUNITY)
Admission: RE | Disposition: A | Discharge status: Home / Self Care | Payer: Self-pay | Source: Ambulatory Visit | Attending: Gastroenterology

## 2017-04-17 ENCOUNTER — Ambulatory Visit (HOSPITAL_COMMUNITY)
Admission: RE | Admit: 2017-04-17 | Discharge: 2017-04-17 | Disposition: A | Discharge status: Home / Self Care | Payer: 59 | Source: Ambulatory Visit | Attending: Gastroenterology | Admitting: Gastroenterology

## 2017-04-17 ENCOUNTER — Encounter (HOSPITAL_COMMUNITY): Payer: Self-pay | Admitting: *Deleted

## 2017-04-17 ENCOUNTER — Encounter (HOSPITAL_BASED_OUTPATIENT_CLINIC_OR_DEPARTMENT_OTHER)
Admission: RE | Disposition: A | Discharge status: Home / Self Care | Payer: Self-pay | Source: Ambulatory Visit | Attending: Surgery

## 2017-04-17 DIAGNOSIS — K8065 Calculus of gallbladder and bile duct with chronic cholecystitis with obstruction: Secondary | ICD-10-CM | POA: Diagnosis not present

## 2017-04-17 DIAGNOSIS — K831 Obstruction of bile duct: Secondary | ICD-10-CM

## 2017-04-17 HISTORY — PX: ERCP: SHX5425

## 2017-04-17 SURGERY — ERCP, WITH INTERVENTION IF INDICATED
Anesthesia: General

## 2017-04-17 MED ORDER — SODIUM CHLORIDE 0.9 % IV SOLN
INTRAVENOUS | Status: DC
  Administered 2017-04-17 (×2): via INTRAVENOUS

## 2017-04-17 MED ORDER — MIDAZOLAM HCL 5 MG/5ML IJ SOLN
INTRAMUSCULAR | Status: DC | PRN
  Administered 2017-04-17: 2 mg via INTRAVENOUS

## 2017-04-17 MED ORDER — PROPOFOL 10 MG/ML IV BOLUS
INTRAVENOUS | Status: DC | PRN
  Administered 2017-04-17: 160 mg via INTRAVENOUS

## 2017-04-17 MED ORDER — FENTANYL CITRATE (PF) 100 MCG/2ML IJ SOLN
INTRAMUSCULAR | Status: DC | PRN
  Administered 2017-04-17: 100 ug via INTRAVENOUS

## 2017-04-17 MED ORDER — SODIUM CHLORIDE 0.9 % IV SOLN
INTRAVENOUS | Status: DC | PRN
  Administered 2017-04-17: 20 mL

## 2017-04-17 MED ORDER — INDOMETHACIN 50 MG RE SUPP
RECTAL | Status: DC | PRN
  Administered 2017-04-17: 50 mg via RECTAL

## 2017-04-17 MED ORDER — ROCURONIUM BROMIDE 100 MG/10ML IV SOLN
INTRAVENOUS | Status: DC | PRN
  Administered 2017-04-17: 50 mg via INTRAVENOUS

## 2017-04-17 MED ORDER — DEXAMETHASONE SODIUM PHOSPHATE 10 MG/ML IJ SOLN
INTRAMUSCULAR | Status: DC | PRN
  Administered 2017-04-17: 5 mg via INTRAVENOUS

## 2017-04-17 MED ORDER — NEOSTIGMINE METHYLSULFATE 10 MG/10ML IV SOLN
INTRAVENOUS | Status: DC | PRN
  Administered 2017-04-17: 3 mg via INTRAVENOUS

## 2017-04-17 MED ORDER — ONDANSETRON HCL 4 MG/2ML IJ SOLN
INTRAMUSCULAR | Status: DC | PRN
  Administered 2017-04-17: 4 mg via INTRAVENOUS

## 2017-04-17 MED ORDER — LIDOCAINE HCL (CARDIAC) 20 MG/ML IV SOLN
INTRAVENOUS | Status: DC | PRN
  Administered 2017-04-17: 80 mg via INTRATRACHEAL

## 2017-04-17 MED ORDER — GLYCOPYRROLATE 0.2 MG/ML IJ SOLN
INTRAMUSCULAR | Status: DC | PRN
  Administered 2017-04-17: .5 mg via INTRAVENOUS

## 2017-04-17 MED ORDER — CIPROFLOXACIN IN D5W 400 MG/200ML IV SOLN
INTRAVENOUS | Status: DC | PRN
  Administered 2017-04-17: 400 mg via INTRAVENOUS

## 2017-04-17 NOTE — H&P (Signed)
Kristi Richmond is an 26 y.o. female.   Chief Complaint: CBD stones HPI: Patient seen and examined and discussed with her mother today and a Careers adviser yesterday and her hospital computer chart was reviewed and today she has a different pain than what brought her to the emergency room and other than the gallbladder pain she has no other GI symptoms and she did have a baby 5 months ago and is still breast-feeding and gallbladder problems do not run in the family and she did fine with her surgery yesterday and we reviewed her allergies and home medicines and she has no other complaints  Past Medical History:  Diagnosis Date  . Cholelithiasis 03/2017  . Chronic cholecystitis 03/2017  . Hypertension    states under control with med., has been on med. since age 36    Past Surgical History:  Procedure Laterality Date  . CHOLECYSTECTOMY    . DERMOID CYST  EXCISION Left 12/19/2003   post-auricular  . WISDOM TOOTH EXTRACTION  2008    Family History  Problem Relation Age of Onset  . Asthma Brother    Social History:  reports that she has never smoked. She has never used smokeless tobacco. She reports that she does not drink alcohol or use drugs.  Allergies:  Allergies  Allergen Reactions  . Sulfa Antibiotics Hives    Medications Prior to Admission  Medication Sig Dispense Refill  . HYDROcodone-acetaminophen (NORCO/VICODIN) 5-325 MG tablet Take 1 tablet by mouth every 4 (four) hours as needed for moderate pain. 20 tablet 0  . labetalol (NORMODYNE) 100 MG tablet Take 100 mg by mouth 2 (two) times daily.    . norethindrone (JENCYCLA) 0.35 MG tablet Take 1 tablet by mouth daily.    . Prenatal Vit-Fe Fumarate-FA (PRENATAL MULTIVITAMIN) TABS tablet Take 1 tablet by mouth daily.       No results found for this or any previous visit (from the past 48 hour(s)). Dg Cholangiogram Operative  Result Date: 04/16/2017 CLINICAL DATA:  Cholelithiasis. EXAM: INTRAOPERATIVE CHOLANGIOGRAM TECHNIQUE:  Cholangiographic images from the C-arm fluoroscopic device were submitted for interpretation post-operatively. Please see the procedural report for the amount of contrast and the fluoroscopy time utilized. COMPARISON:  Abdominal ultrasound 04/07/2017 FINDINGS: There is a rounded filling defect within the distal common bile duct compatible with stone. Measurement is not possible on this non calibrated image. The biliary system is not dilated. Small volume of contrast spill into the duodenum. Intrahepatic ducts appear smooth and symmetrically visualized. These findings were discussed with Dr. Gerrit Friends by phone at time time of dictation. IMPRESSION: 1. Single, small filling defect in the nondilated distal common bile duct consistent with calculus. 2. Small volume contrast spillage into the duodenum. Electronically Signed   By: Marnee Spring M.D.   On: 04/16/2017 12:16    ROS negative except above  Blood pressure (!) 141/98, pulse 74, temperature 98.7 F (37.1 C), temperature source Oral, resp. rate (!) 21, SpO2 100 %, currently breastfeeding. Physical Exam patient looks good postop no acute distress vital signs stable afebrile exam please see preassessment evaluation ultrasound and Intra-Op cholangiogram and previous labs reviewed  Assessment/Plan Positive Intra-Op cholangiogram Plan: The risks benefits methods and success rate of ERCP and stone extraction was discussed with the patient and her mother and will proceed this morning with anesthesia assistance with further workup and plans pending those findings  [facility] E, MD 04/17/2017, 9:58 AM

## 2017-04-17 NOTE — Discharge Instructions (Signed)
YOU HAD AN ENDOSCOPIC PROCEDURE TODAY: Refer to the procedure report and other information in the discharge instructions given to you for any specific questions about what was found during the examination. If this information does not answer your questions, please call [facility] office at [phone] to clarify.   YOU SHOULD EXPECT: Some feelings of bloating in the abdomen. Passage of more gas than usual. Walking can help get rid of the air that was put into your GI tract during the procedure and reduce the bloating. Some abdominal soreness may be present for a day or two, also.  DIET: Your first meal following the procedure should be a light meal and then it is ok to progress to your normal diet. A half-sandwich or bowl of soup is an example of a good first meal. Heavy or fried foods are harder to digest and may make you feel nauseous or bloated. Drink plenty of fluids but you should avoid alcoholic beverages for 24 hours. If you had a esophageal dilation, please see attached instructions for diet.   ACTIVITY: Your care partner should take you home directly after the procedure. You should plan to take it easy, moving slowly for the rest of the day. You can resume normal activity the day after the procedure however YOU SHOULD NOT DRIVE, use power tools, machinery or perform tasks that involve climbing or major physical exertion for 24 hours (because of the sedation medicines used during the test).   SYMPTOMS TO REPORT IMMEDIATELY: A gastroenterologist can be reached at any hour. Please call [phone]  for any of the following symptoms:  Following upper endoscopy (EGD, EUS, ERCP, esophageal dilation) Vomiting of blood or coffee ground material  New, significant abdominal pain  New, significant chest pain or pain under the shoulder blades  Painful or persistently difficult swallowing  New shortness of breath  Black, tarry-looking or red, bloody stools  FOLLOW UP:  If any biopsies were taken you  will be contacted by phone or by letter within the next 1-3 weeks. Call [phone]  if you have not heard about the biopsies in 3 weeks.  Please also call with any specific questions about appointments or follow up tests.  Call if question or problem from my standpoint and call surgical team for their questions and happy to see back in the office if any GI symptoms after postop recovery and may have clear liquids today up until 5 PM and if doing well this evening may have soft solids and advance diet tomorrow as tolerated

## 2017-04-17 NOTE — Anesthesia Procedure Notes (Signed)
Procedure Name: Intubation Date/Time: 04/17/2017 10:20 AM Performed by: Marena Chancy Pre-anesthesia Checklist: Patient identified, Emergency Drugs available, Suction available and Patient being monitored Patient Re-evaluated:Patient Re-evaluated prior to induction Oxygen Delivery Method: Circle System Utilized Preoxygenation: Pre-oxygenation with 100% oxygen Induction Type: IV induction Ventilation: Mask ventilation without difficulty Laryngoscope Size: Miller and 2 Grade View: Grade II Tube type: Oral Tube size: 7.0 mm Number of attempts: 1 Airway Equipment and Method: Stylet and Oral airway Placement Confirmation: ETT inserted through vocal cords under direct vision,  positive ETCO2 and breath sounds checked- equal and bilateral Tube secured with: Tape Dental Injury: Teeth and Oropharynx as per pre-operative assessment

## 2017-04-17 NOTE — Anesthesia Preprocedure Evaluation (Signed)
Anesthesia Evaluation  Patient identified by MRN, date of birth, ID band Patient awake    Reviewed: Allergy & Precautions, NPO status , Patient's Chart, lab work & pertinent test results  Airway Mallampati: I   Neck ROM: Limited    Dental   Pulmonary neg pulmonary ROS,    breath sounds clear to auscultation       Cardiovascular hypertension, negative cardio ROS   Rhythm:Regular Rate:Normal     Neuro/Psych negative neurological ROS  negative psych ROS   GI/Hepatic negative GI ROS, Neg liver ROS,   Endo/Other  negative endocrine ROS  Renal/GU negative Renal ROS  negative genitourinary   Musculoskeletal negative musculoskeletal ROS (+)   Abdominal   Peds negative pediatric ROS (+)  Hematology negative hematology ROS (+)   Anesthesia Other Findings   Reproductive/Obstetrics negative OB ROS                             Anesthesia Physical Anesthesia Plan  ASA: I  Anesthesia Plan: General   Post-op Pain Management:    Induction: Intravenous  PONV Risk Score and Plan: 3 and Ondansetron, Dexamethasone, Midazolam and Propofol infusion  Airway Management Planned: Oral ETT  Additional Equipment:   Intra-op Plan:   Post-operative Plan: Extubation in OR  Informed Consent: I have reviewed the patients History and Physical, chart, labs and discussed the procedure including the risks, benefits and alternatives for the proposed anesthesia with the patient or authorized representative who has indicated his/her understanding and acceptance.   Dental advisory given  Plan Discussed with: CRNA  Anesthesia Plan Comments:         Anesthesia Quick Evaluation

## 2017-04-17 NOTE — Op Note (Signed)
[facility] Patient Name: Kristi Richmond Procedure Date : 04/17/2017 MRN: [id number] Attending MD: Vida Rigger , MD Date of Birth: 1991-02-12 CSN: [id number] Age: 26 Admit Type: Outpatient Procedure:                ERCP Indications:              Bile duct stone(s)on Intra-Op cholangiogram Providers:                Vida Rigger, MD, Bonney Leitz, Norman Clay, RN,                            Arlee Muslim Tech., Technician, Albertina Senegal.                            Beckner, CRNA Referring MD:              Medicines:                General Anesthesia Complications:            No immediate complications. Estimated Blood Loss:     Estimated blood loss: none. Procedure:                Pre-Anesthesia Assessment:                           - Prior to the procedure, a History and Physical                            was performed, and patient medications and                            allergies were reviewed. The patient's tolerance of                            previous anesthesia was also reviewed. The risks                            and benefits of the procedure and the sedation                            options and risks were discussed with the patient.                            All questions were answered, and informed consent                            was obtained. Prior Anticoagulants: The patient has                            taken no previous anticoagulant or antiplatelet                            agents. ASA Grade Assessment: I - A normal, healthy  patient. After reviewing the risks and benefits,                            the patient was deemed in satisfactory condition to                            undergo the procedure.                           After obtaining informed consent, the scope was                            passed under direct vision. Throughout the                            procedure, the patient's blood pressure, pulse, and                         oxygen saturations were monitored continuously. The                            [id number]) scope was introduced through                            the mouth, and used to inject contrast into and                            used to cannulate the bile duct. The ERCP was                            accomplished without difficulty. The patient                            tolerated the procedure well. Scope In: Scope Out: Findings:      The major papilla was normal. on the first 2 attempts of cannulation the       wire seemed to go towards the pancreas and it was repositioned both       times and on the third attempt deep selective cannulation was obtained       and on initial injection a small stone with follow-up was seen and we       proceeded with Biliary sphincterotomy which was made with a Hydratome       sphincterotome using ERBE electrocautery. until we had adequate biliary       drainage and could get the fully bowed sphincterotome easily in and out       of the duct There was no post-sphincterotomy bleeding.       Choledocholithiasis was found in a nondilated duct. To discover objects,       the biliary tree was swept with a 9-12 mm adjustable balloon starting at       the bifurcationusing the 12 mm balloon and left intrahepatic duct(s)at       the end of the procedure using the 9 mm balloon. and Nothing was found.       no obvious stone was delivered but on occlusion cholangiogram at the end  of the procedure no obvious residual stone was seen and she had adequate       biliary drainage and the wire and scope were removed and the patient       tolerated the procedure well there was no obvious immediate complication Impression:               - The major papilla appeared normal.                           - Choledocholithiasis was found on initial                            cholangiogram not seen delivered but not present on                             occlusion cholae end of the procedure.                           - A biliary sphincterotomy was performed.                           - The biliary tree was swept as above and nothing                            was found. he wire was advanced to times towards                            pancreas as above but no dye was injected into the                            pancreas Recommendation:           - Clear liquid diet for 6 hours.may slowly advance                            as tolerated                           - Continue present medications.                           - Return to GI clinic PRN.                           - Telephone GI clinic if symptomatic PRN. Procedure Code(s):        --- Professional ---                           [phone], Endoscopic retrograde                            cholangiopancreatography (ERCP); with                            sphincterotomy/papillotomy Diagnosis Code(s):        --- Professional ---  K80.50, Calculus of bile duct without cholangitis                            or cholecystitis without obstruction CPT copyright 2016 [employer] Association. All rights reserved. The codes documented in this report are preliminary and upon coder review may  be revised to meet current compliance requirements. Vida Rigger, MD 04/17/2017 11:09:57 AM This report has been signed electronically. Number of Addenda: 0

## 2017-04-17 NOTE — Transfer of Care (Signed)
Immediate Anesthesia Transfer of Care Note  Patient: [name]  Procedure(s) Performed: Procedure(s): ENDOSCOPIC RETROGRADE CHOLANGIOPANCREATOGRAPHY (ERCP) (N/A)  Patient Location: Endoscopy Unit  Anesthesia Type:General  Level of Consciousness: awake, alert  and oriented  Airway & Oxygen Therapy: Patient Spontanous Breathing and Patient connected to nasal cannula oxygen  Post-op Assessment: Report given to RN, Post -op Vital signs reviewed and stable and Patient moving all extremities X 4  Post vital signs: Reviewed and stable  Last Vitals:  Vitals:   04/17/17 0920 04/17/17 1119  BP: (!) 141/98   Pulse: 74 98  Resp: (!) 21 (!) 22  Temp: 37.1 C 36.9 C  SpO2: 100% 100%    Last Pain:  Vitals:   04/17/17 1119  TempSrc: Oral  PainSc:       Patients Stated Pain Goal: 2 (04/17/17 0920)  Complications: No apparent anesthesia complications

## 2017-04-20 ENCOUNTER — Encounter (HOSPITAL_COMMUNITY): Payer: Self-pay | Admitting: Gastroenterology

## 2017-04-20 NOTE — Anesthesia Postprocedure Evaluation (Signed)
Anesthesia Post Note  Patient: Kristi Richmond  Procedure(s) Performed: Procedure(s) (LRB): ENDOSCOPIC RETROGRADE CHOLANGIOPANCREATOGRAPHY (ERCP) (N/A)     Patient location during evaluation: PACU Anesthesia Type: General Level of consciousness: awake Pain management: pain level controlled Vital Signs Assessment: post-procedure vital signs reviewed and stable Respiratory status: spontaneous breathing, nonlabored ventilation, respiratory function stable and patient connected to nasal cannula oxygen Cardiovascular status: blood pressure returned to baseline and stable Postop Assessment: no signs of nausea or vomiting Anesthetic complications: no    Last Vitals:  Vitals:   04/17/17 1210 04/17/17 1220  BP: (!) 165/94 (!) 153/97  Pulse: 68 65  Resp: 14 18  Temp:    SpO2: 100% 99%    Last Pain:  Vitals:   04/17/17 1119  TempSrc: Oral  PainSc:                  Duwan Adrian,JAMES TERRILL

## 2017-08-25 NOTE — L&D Delivery Note (Signed)
Delivery Note Patient pushed for less than 10 minutes after she was noted to be C/C/+3 At 2:01 AM a viable and healthy female was delivered via Vaginal, Spontaneous (Presentation:ROA ).  APGAR9/9 weight pending. Delivery call had been made prior to delivery due to deep decelerations, however baby was delivered with vigorous cry and moving all four extremities and with good color, so the Pediatricians left.  The baby was placed on maternal abdomen. Delayed cord clamping was done.  Cord cut by Grandmother.  Cord gases obtained.  Cord blood obtained.  Placenta spontaneously delivered intact with trailing membranes.  3 Vessel cord noted.  Uterine atony alleviated by IV pitocin and massage.  There were no lacerations noted.  Patient tolerated delivery well.     Anesthesia:  Epidural Episiotomy:  n/a Lacerations:  n/a Suture Repair: none Est. Blood Loss (mL):  Pending Triton measuring approximated to less than 200 mLO  Mom to postpartum.  Baby to Couplet care / Skin to Skin.  Essie Hart STACIA 06/18/2018, 2:10 AM

## 2017-11-18 LAB — OB RESULTS CONSOLE RUBELLA ANTIBODY, IGM: Rubella: IMMUNE

## 2017-11-18 LAB — OB RESULTS CONSOLE HEPATITIS B SURFACE ANTIGEN: HEP B S AG: NEGATIVE

## 2017-11-18 LAB — OB RESULTS CONSOLE ABO/RH: RH Type: POSITIVE

## 2017-11-18 LAB — OB RESULTS CONSOLE GC/CHLAMYDIA
Chlamydia: NEGATIVE
Gonorrhea: NEGATIVE

## 2017-11-18 LAB — OB RESULTS CONSOLE HIV ANTIBODY (ROUTINE TESTING): HIV: NONREACTIVE

## 2017-11-18 LAB — OB RESULTS CONSOLE RPR: RPR: NONREACTIVE

## 2017-11-18 LAB — OB RESULTS CONSOLE ANTIBODY SCREEN: ANTIBODY SCREEN: NEGATIVE

## 2018-01-13 IMAGING — RF DG CHOLANGIOGRAM OPERATIVE
1 series · 9 of 9 positions shown · non-contrast
Comparison: Abdominal ultrasound 04/07/2017

CLINICAL DATA: Cholelithiasis.

EXAM:
INTRAOPERATIVE CHOLANGIOGRAM
TECHNIQUE: Cholangiographic images from the C-arm fluoroscopic device were
submitted for interpretation post-operatively. Please see the
procedural report for the amount of contrast and the fluoroscopy
time utilized.

[Series 1: run · 3 acquisitions, 9 frames shown]
[im 1/3]
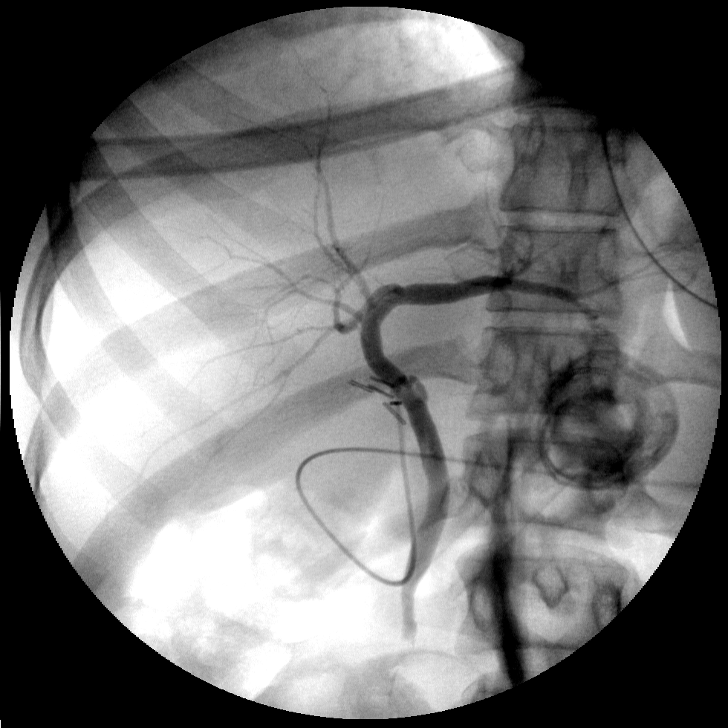
[im 1/3]
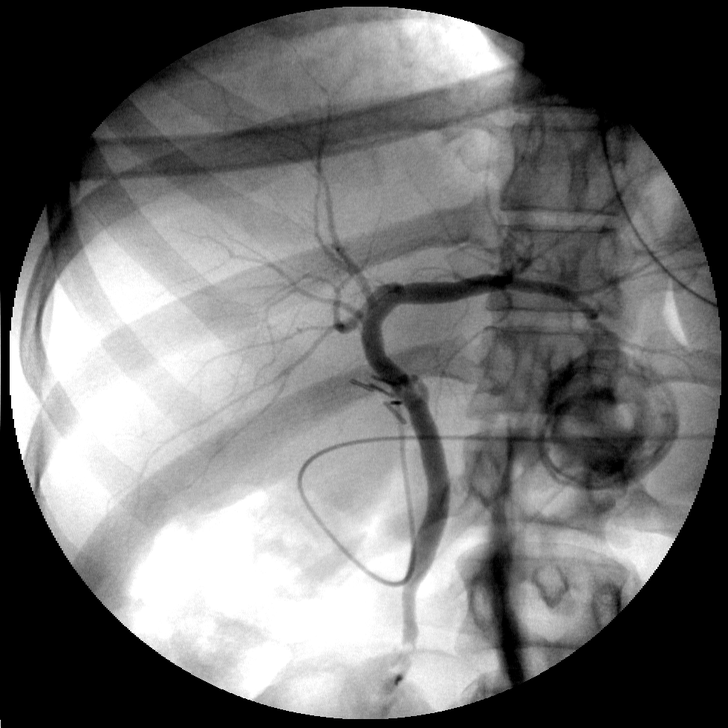
[im 1/3]
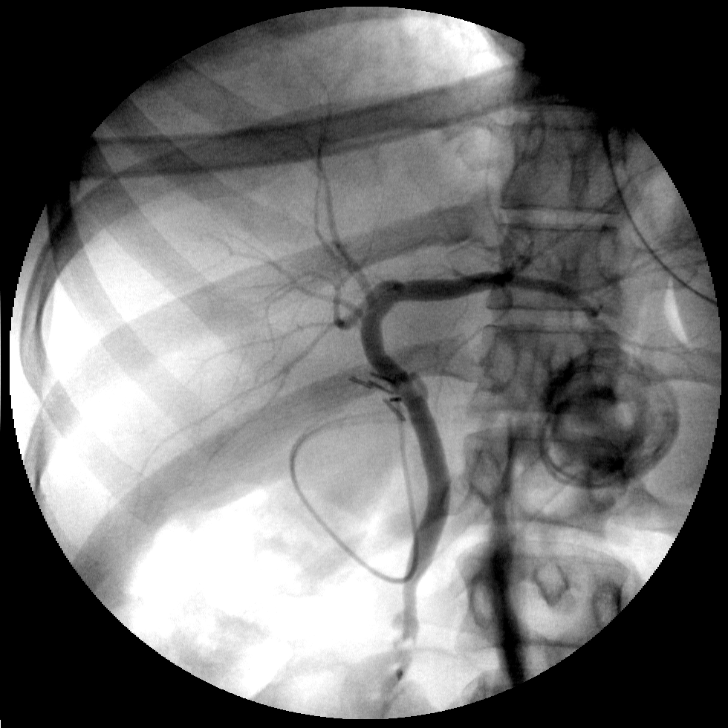
[im 1/3]
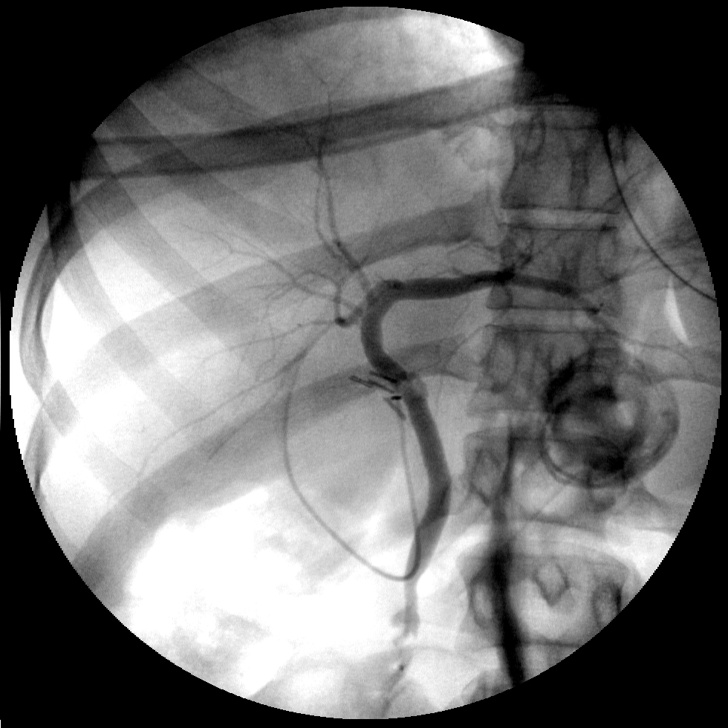
[im 2/3]
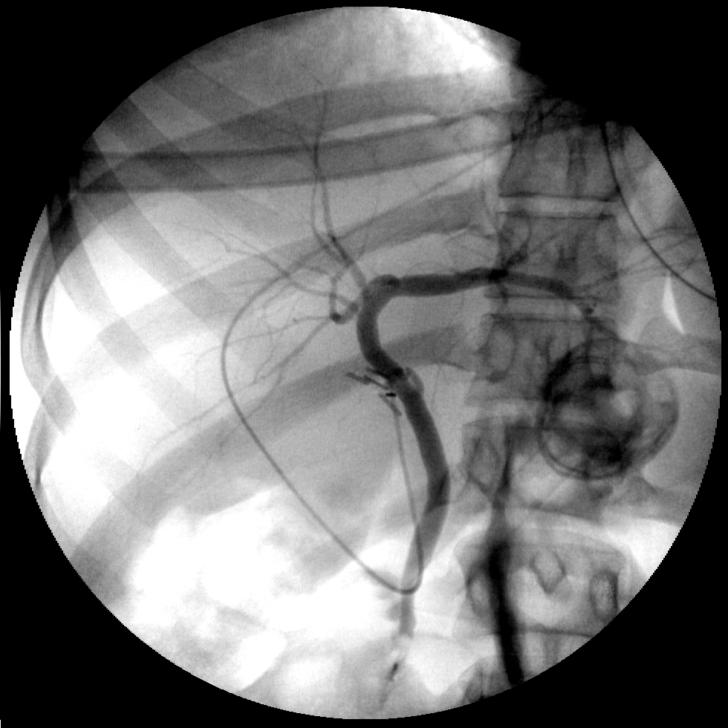
[im 2/3]
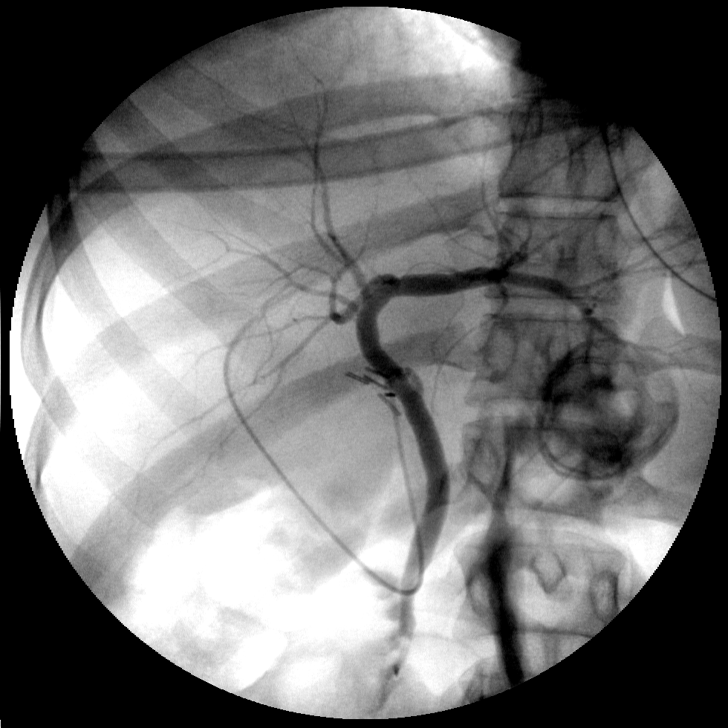
[im 2/3]
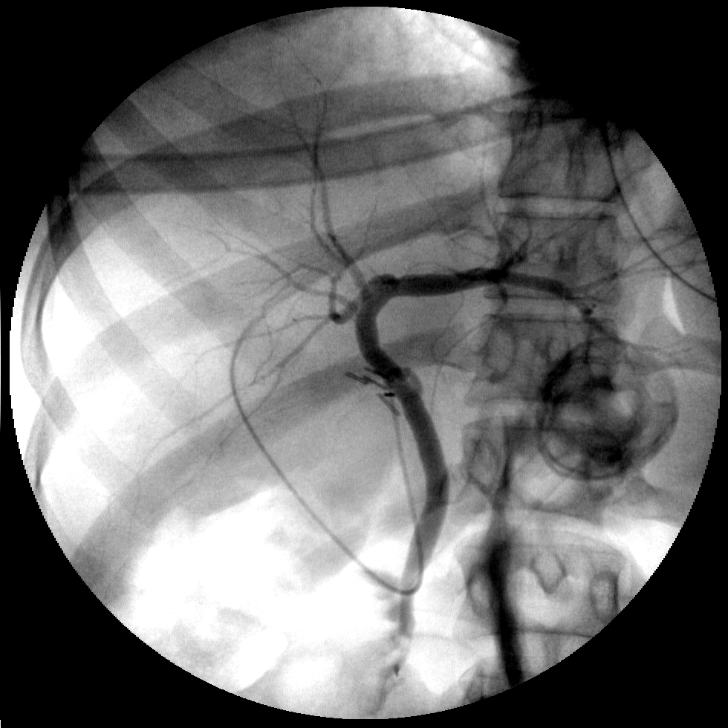
[im 2/3]
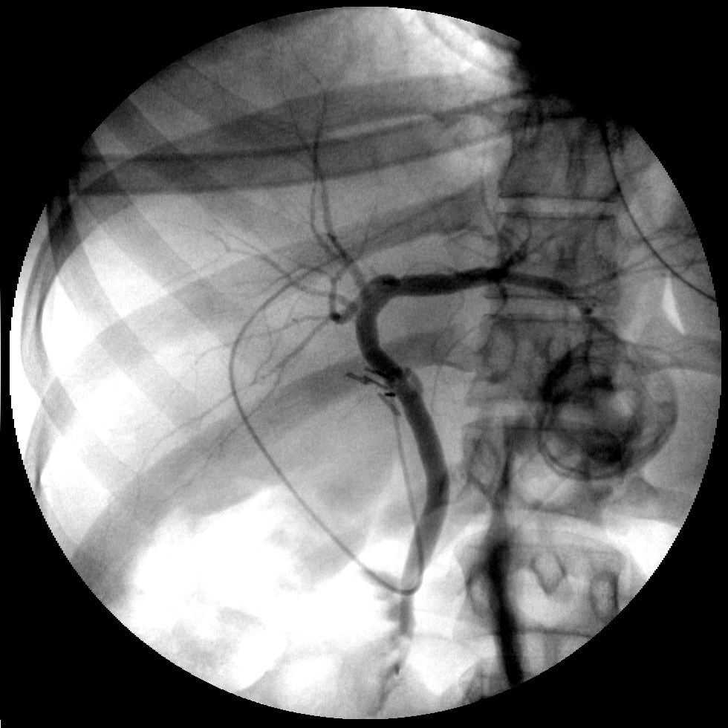
[im 3/3]
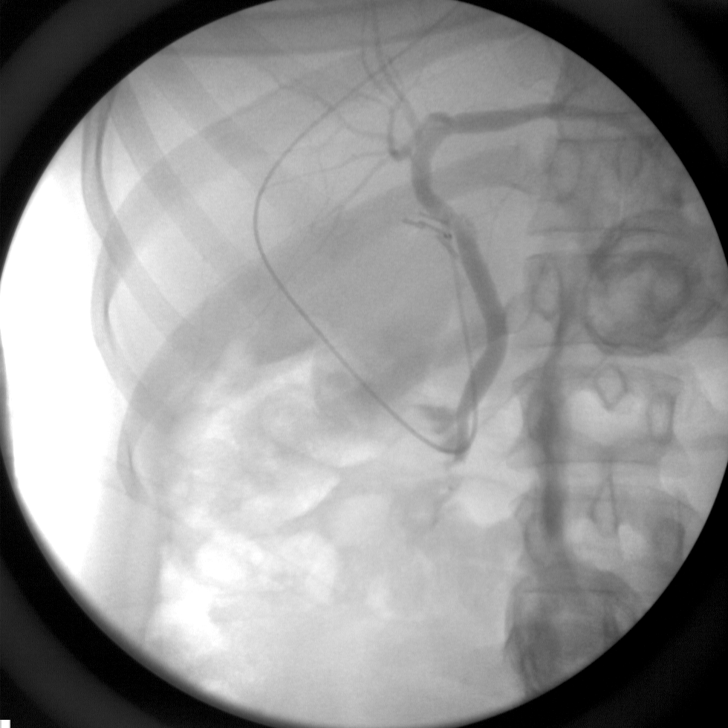

[9 of 9 positions shown; findings below may reference images not displayed]

FINDINGS: There is a rounded filling defect within the distal common bile duct
compatible with stone. Measurement is not possible on this non
calibrated image. The biliary system is not dilated. Small volume of
contrast spill into the duodenum. Intrahepatic ducts appear smooth
and symmetrically visualized. These findings were discussed with Dr.
Pahor by phone at time time of dictation.
IMPRESSION: 1. Single, small filling defect in the nondilated distal common bile
duct consistent with calculus.
2. Small volume contrast spillage into the duodenum.

## 2018-03-15 ENCOUNTER — Encounter (HOSPITAL_COMMUNITY): Payer: Self-pay | Admitting: *Deleted

## 2018-03-15 ENCOUNTER — Inpatient Hospital Stay (HOSPITAL_COMMUNITY)
Admission: AD | Admit: 2018-03-15 | Discharge: 2018-03-15 | Disposition: A | Discharge status: Home / Self Care | Payer: 59 | Source: Ambulatory Visit | Attending: Obstetrics and Gynecology | Admitting: Obstetrics and Gynecology

## 2018-03-15 DIAGNOSIS — O26892 Other specified pregnancy related conditions, second trimester: Secondary | ICD-10-CM | POA: Insufficient documentation

## 2018-03-15 DIAGNOSIS — O10012 Pre-existing essential hypertension complicating pregnancy, second trimester: Secondary | ICD-10-CM | POA: Diagnosis not present

## 2018-03-15 DIAGNOSIS — Z3A24 24 weeks gestation of pregnancy: Secondary | ICD-10-CM | POA: Diagnosis not present

## 2018-03-15 DIAGNOSIS — Z882 Allergy status to sulfonamides status: Secondary | ICD-10-CM | POA: Insufficient documentation

## 2018-03-15 DIAGNOSIS — N898 Other specified noninflammatory disorders of vagina: Secondary | ICD-10-CM | POA: Insufficient documentation

## 2018-03-15 LAB — URINALYSIS, ROUTINE W REFLEX MICROSCOPIC
BILIRUBIN URINE: NEGATIVE
Glucose, UA: NEGATIVE mg/dL
HGB URINE DIPSTICK: NEGATIVE
Ketones, ur: NEGATIVE mg/dL
Leukocytes, UA: NEGATIVE
NITRITE: NEGATIVE
PH: 7 (ref 5.0–8.0)
Protein, ur: NEGATIVE mg/dL
SPECIFIC GRAVITY, URINE: 1.008 (ref 1.005–1.030)

## 2018-03-15 LAB — POCT FERN TEST: POCT Fern Test: NEGATIVE

## 2018-03-15 NOTE — MAU Note (Signed)
PT SAYS AT 9PM- SHE HAD GOTTEN OUT OF  SHOWER -   SHE FELT  LIGHT LEAKING  AND STILL FEELS FLUID COMING OUT.   BABY MOVING.   LAST SEX-   THIS AM.  NO PAIN . NO UC'S.

## 2018-03-15 NOTE — MAU Provider Note (Signed)
Chief Complaint:  Rupture of Membranes   None    HPI: Kristi Richmond is a 27 y.o. G3P1001 at 5159w4d with CHTN on labetalol 100mg  daily who presents to maternity admissions reporting around 9pm tonight pt felt fluid leaking from the vagina. Pt denies this ever happening before. Pt had intercourse this morning without pain, leaking or bleeding, nor does she have pain or bleeding now. Pt denies cxt, endorses + FM. Pt denies knowing that she has increase in vaginal discharge, no dysuria, no vaginal itching or irritations. Pt unsure if she was urinnating or fluid was coming from the vagina.    Pregnancy Course:   Past Medical History:  Diagnosis Date  . Cholelithiasis 03/2017  . Chronic cholecystitis 03/2017  . Hypertension    states under control with med., has been on med. since age 27   OB History  Gravida Para Term Preterm AB Living  3 1 1     1   SAB TAB Ectopic Multiple Live Births        0 1    # Outcome Date GA Lbr Len/2nd Weight Sex Delivery Anes PTL Lv  3 Current           2 Term 12/01/16 10643w4d 07:42 / 00:44 2.914 kg (6 lb 6.8 oz) F Vag-Spont EPI, Local  LIV  1 Gravida            Past Surgical History:  Procedure Laterality Date  . CHOLECYSTECTOMY    . CHOLECYSTECTOMY N/A 04/16/2017   Procedure: LAPAROSCOPIC CHOLECYSTECTOMY WITH INTRAOPERATIVE CHOLANGIOGRAM;  Surgeon: Darnell LevelGerkin, Todd, MD;  Location: [facility];  Service: General;  Laterality: N/A;  . DERMOID CYST  EXCISION Left 12/19/2003   post-auricular  . ERCP N/A 04/17/2017   Procedure: ENDOSCOPIC RETROGRADE CHOLANGIOPANCREATOGRAPHY (ERCP);  Surgeon: Vida RiggerMagod, Marc, MD;  Location: [facility];  Service: Endoscopy;  Laterality: N/A;  . WISDOM TOOTH EXTRACTION  2008   Family History  Problem Relation Age of Onset  . Asthma Brother    Social History   Tobacco Use  . Smoking status: Never Smoker  . Smokeless tobacco: Never Used  Substance Use Topics  . Alcohol use: No  . Drug use: No   Allergies   Allergen Reactions  . Sulfa Antibiotics Hives   Medications Prior to Admission  Medication Sig Dispense Refill Last Dose  . labetalol (NORMODYNE) 100 MG tablet Take 100 mg by mouth 2 (two) times daily.   03/15/2018 at Unknown time  . Prenatal Vit-Fe Fumarate-FA (PRENATAL MULTIVITAMIN) TABS tablet Take 1 tablet by mouth daily.    03/14/2018 at Unknown time  . HYDROcodone-acetaminophen (NORCO/VICODIN) 5-325 MG tablet Take 1 tablet by mouth every 4 (four) hours as needed for moderate pain. 20 tablet 0 Unknown at Unknown time  . norethindrone (JENCYCLA) 0.35 MG tablet Take 1 tablet by mouth daily.   Unknown at Unknown time    I have reviewed patient's Past Medical Hx, Surgical Hx, Family Hx, Social Hx, medications and allergies.   ROS:  Review of Systems  All other systems reviewed and are negative.   Physical Exam   Patient Vitals for the past 24 hrs:  BP Temp Temp src Pulse Resp Height Weight  03/15/18 2147 136/80 98.2 F (36.8 C) Oral 85 20 5' 5.5" (1.664 m) 81.4 kg (179 lb 8 oz)   Constitutional: Well-developed, well-nourished female in no acute distress.  Cardiovascular: normal rate Respiratory: normal effort GI: Abd soft, non-tender, gravid appropriate for gestational age. Pos BS x  4 MS: Extremities nontender, no edema, normal ROM Neurologic: Alert and oriented x 4.  GU: Neg CVAT.  Pelvic: NEFG, physiologic discharge, no blood, cervix clean. Pelvic adequate for labor. No CMT  Dilation: Closed Effacement (%): Thick Cervical Position: Posterior Exam by:: Dale [place] NP   NST: FHR baseline 140s bpm, Variability: moderate, Accelerations:present, Decelerations:  Absent= Cat 1/Reactive UC:   none SVE:   Dilation: Closed Effacement (%): Thick Exam by:: Dale [place] NP  Labs: Results for orders placed or performed during the hospital encounter of 03/15/18 (from the past 24 hour(s))  Urinalysis, Routine w reflex microscopic     Status: None   Collection Time: 03/15/18 10:20  PM  Result Value Ref Range   Color, Urine YELLOW YELLOW   APPearance CLEAR CLEAR   Specific Gravity, Urine 1.008 1.005 - 1.030   pH 7.0 5.0 - 8.0   Glucose, UA NEGATIVE NEGATIVE mg/dL   Hgb urine dipstick NEGATIVE NEGATIVE   Bilirubin Urine NEGATIVE NEGATIVE   Ketones, ur NEGATIVE NEGATIVE mg/dL   Protein, ur NEGATIVE NEGATIVE mg/dL   Nitrite NEGATIVE NEGATIVE   Leukocytes, UA NEGATIVE NEGATIVE  Fern Test     Status: Normal   Collection Time: 03/15/18 10:47 PM  Result Value Ref Range   POCT Fern Test Negative = intact amniotic membranes     Imaging:  No results found.  MAU Course: Orders Placed This Encounter  Procedures  . Urinalysis, Routine w reflex microscopic  . Amnisure rupture of membrane (rom)not at [facility]  . Diet - low sodium heart healthy  . Increase activity slowly  . Call MD for:  . Call MD for:  temperature >100.4  . Call MD for:  persistant nausea and vomiting  . Call MD for:  severe uncontrolled pain  . Call MD for:  redness, tenderness, or signs of infection (pain, swelling, redness, odor or green/yellow discharge around incision site)  . Call MD for:  difficulty breathing, headache or visual disturbances  . Call MD for:  hives  . Call MD for:  persistant dizziness or light-headedness  . Call MD for:  extreme fatigue  . (HEART FAILURE PATIENTS) Call MD:  Anytime you have any of the following symptoms: 1) 3 pound weight gain in 24 hours or 5 pounds in 1 week 2) shortness of breath, with or without a dry hacking cough 3) swelling in the hands, feet or stomach 4) if you have to sleep on extra pillows at night in order to breathe.  Crist Fat Test  . Discharge patient Discharge disposition: 01-Home or Self Care; Discharge patient date: 03/15/2018   No orders of the defined types were placed in this encounter.   MDM: UA neg, fern neg, discharge home.   Assessment:  Kristi Richmond is a 27 y.o. G3P1001 at [redacted]w[redacted]d with CHTN controlled on labetalol 100mg  daily dx with  increased physiological vaginal discharge during second trimester. Reassuring fetal strip for gestational age. Negative UA and fern, neg pooling on speculum exam. Pt stable and discharge home.  1. Vaginal discharge during pregnancy in second trimester   2. [redacted] weeks gestation of pregnancy     Plan: Discharge home in stable condition.  Labor precautions and fetal kick counts Can wear a pad at home and continue to see if there is a leaking of fluid, or popping feeling please return to the MAU otherwise f/u with Dr Mora Appl in 2 days.  Follow-up Information    [facility] Follow up.  Specialty:  Obstetrics and Gynecology Why:  ROB with Dr Mora Appl in 2 days Contact information: [address] [place] [address] [phone]          Allergies as of 03/15/2018      Reactions   Sulfa Antibiotics Hives      Medication List    STOP taking these medications   HYDROcodone-acetaminophen 5-325 MG tablet Commonly known as:  NORCO/VICODIN   JENCYCLA 0.35 MG tablet Generic drug:  norethindrone     TAKE these medications   labetalol 100 MG tablet Commonly known as:  NORMODYNE Take 100 mg by mouth 2 (two) times daily.   prenatal multivitamin Tabs tablet Take 1 tablet by mouth daily.       [facility] NP-C, CNM [place], [place] 03/15/2018 10:52 PM

## 2018-03-15 NOTE — Discharge Instructions (Signed)
Braxton Hicks Contractions °Contractions of the uterus can occur throughout pregnancy, but they are not always a sign that you are in labor. You may have practice contractions called Braxton Hicks contractions. These false labor contractions are sometimes confused with true labor. °What are Braxton Hicks contractions? °Braxton Hicks contractions are tightening movements that occur in the muscles of the uterus before labor. Unlike true labor contractions, these contractions do not result in opening (dilation) and thinning of the cervix. Toward the end of pregnancy (32-34 weeks), Braxton Hicks contractions can happen more often and may become stronger. These contractions are sometimes difficult to tell apart from true labor because they can be very uncomfortable. You should not feel embarrassed if you go to the hospital with false labor. °Sometimes, the only way to tell if you are in true labor is for your health care provider to look for changes in the cervix. The health care provider will do a physical exam and may monitor your contractions. If you are not in true labor, the exam should show that your cervix is not dilating and your water has not broken. °If there are other health problems associated with your pregnancy, it is completely safe for you to be sent home with false labor. You may continue to have Braxton Hicks contractions until you go into true labor. °How to tell the difference between true labor and false labor °True labor °· Contractions last 30-70 seconds. °· Contractions become very regular. °· Discomfort is usually felt in the top of the uterus, and it spreads to the lower abdomen and low back. °· Contractions do not go away with walking. °· Contractions usually become more intense and increase in frequency. °· The cervix dilates and gets thinner. °False labor °· Contractions are usually shorter and not as strong as true labor contractions. °· Contractions are usually irregular. °· Contractions  are often felt in the front of the lower abdomen and in the groin. °· Contractions may go away when you walk around or change positions while lying down. °· Contractions get weaker and are shorter-lasting as time goes on. °· The cervix usually does not dilate or become thin. °Follow these instructions at home: °· Take over-the-counter and prescription medicines only as told by your health care provider. °· Keep up with your usual exercises and follow other instructions from your health care provider. °· Eat and drink lightly if you think you are going into labor. °· If Braxton Hicks contractions are making you uncomfortable: °? Change your position from lying down or resting to walking, or change from walking to resting. °? Sit and rest in a tub of warm water. °? Drink enough fluid to keep your urine pale yellow. Dehydration may cause these contractions. °? Do slow and deep breathing several times an hour. °· Keep all follow-up prenatal visits as told by your health care provider. This is important. °Contact a health care provider if: °· You have a fever. °· You have continuous pain in your abdomen. °Get help right away if: °· Your contractions become stronger, more regular, and closer together. °· You have fluid leaking or gushing from your vagina. °· You pass blood-tinged mucus (bloody show). °· You have bleeding from your vagina. °· You have low back pain that you never had before. °· You feel your baby’s head pushing down and causing pelvic pressure. °· Your baby is not moving inside you as much as it used to. °Summary °· Contractions that occur before labor are called Braxton   Hicks contractions, false labor, or practice contractions. °· Braxton Hicks contractions are usually shorter, weaker, farther apart, and less regular than true labor contractions. True labor contractions usually become progressively stronger and regular and they become more frequent. °· Manage discomfort from Braxton Hicks contractions by  changing position, resting in a warm bath, drinking plenty of water, or practicing deep breathing. °This information is not intended to replace advice given to you by your health care provider. Make sure you discuss any questions you have with your health care provider. °Document Released: 12/25/2016 Document Revised: 12/25/2016 Document Reviewed: 12/25/2016 °Elsevier Interactive Patient Education © 2018 [employer] Inc[employer] ° °

## 2018-05-27 DIAGNOSIS — O10019 Pre-existing essential hypertension complicating pregnancy, unspecified trimester: Secondary | ICD-10-CM | POA: Diagnosis not present

## 2018-05-27 DIAGNOSIS — Z3A35 35 weeks gestation of pregnancy: Secondary | ICD-10-CM | POA: Diagnosis not present

## 2018-05-28 DIAGNOSIS — Z3682 Encounter for antenatal screening for nuchal translucency: Secondary | ICD-10-CM | POA: Diagnosis not present

## 2018-05-28 DIAGNOSIS — Z3492 Encounter for supervision of normal pregnancy, unspecified, second trimester: Secondary | ICD-10-CM | POA: Diagnosis not present

## 2018-06-02 LAB — OB RESULTS CONSOLE GBS: GBS: POSITIVE

## 2018-06-03 DIAGNOSIS — O10019 Pre-existing essential hypertension complicating pregnancy, unspecified trimester: Secondary | ICD-10-CM | POA: Diagnosis not present

## 2018-06-03 DIAGNOSIS — Z3A36 36 weeks gestation of pregnancy: Secondary | ICD-10-CM | POA: Diagnosis not present

## 2018-06-11 DIAGNOSIS — Z3A37 37 weeks gestation of pregnancy: Secondary | ICD-10-CM | POA: Diagnosis not present

## 2018-06-11 DIAGNOSIS — Z3493 Encounter for supervision of normal pregnancy, unspecified, third trimester: Secondary | ICD-10-CM | POA: Diagnosis not present

## 2018-06-11 DIAGNOSIS — O10019 Pre-existing essential hypertension complicating pregnancy, unspecified trimester: Secondary | ICD-10-CM | POA: Diagnosis not present

## 2018-06-15 ENCOUNTER — Telehealth (HOSPITAL_COMMUNITY): Payer: Self-pay | Admitting: *Deleted

## 2018-06-15 ENCOUNTER — Encounter (HOSPITAL_COMMUNITY): Payer: Self-pay | Admitting: *Deleted

## 2018-06-15 NOTE — Telephone Encounter (Signed)
Preadmission screen  

## 2018-06-17 ENCOUNTER — Inpatient Hospital Stay (HOSPITAL_COMMUNITY): Payer: BLUE CROSS/BLUE SHIELD | Admitting: Anesthesiology

## 2018-06-17 ENCOUNTER — Encounter (HOSPITAL_COMMUNITY): Payer: Self-pay | Admitting: *Deleted

## 2018-06-17 ENCOUNTER — Inpatient Hospital Stay (HOSPITAL_COMMUNITY)
Admission: AD | Admit: 2018-06-17 | Discharge: 2018-06-20 | DRG: 807 | Disposition: A | Discharge status: Home / Self Care | Payer: BLUE CROSS/BLUE SHIELD | Attending: Obstetrics & Gynecology | Admitting: Obstetrics & Gynecology

## 2018-06-17 DIAGNOSIS — O99824 Streptococcus B carrier state complicating childbirth: Secondary | ICD-10-CM | POA: Diagnosis not present

## 2018-06-17 DIAGNOSIS — O1002 Pre-existing essential hypertension complicating childbirth: Secondary | ICD-10-CM | POA: Diagnosis not present

## 2018-06-17 DIAGNOSIS — Z23 Encounter for immunization: Secondary | ICD-10-CM | POA: Diagnosis not present

## 2018-06-17 DIAGNOSIS — Z3A38 38 weeks gestation of pregnancy: Secondary | ICD-10-CM

## 2018-06-17 DIAGNOSIS — O10919 Unspecified pre-existing hypertension complicating pregnancy, unspecified trimester: Secondary | ICD-10-CM | POA: Diagnosis present

## 2018-06-17 DIAGNOSIS — O10013 Pre-existing essential hypertension complicating pregnancy, third trimester: Secondary | ICD-10-CM | POA: Diagnosis not present

## 2018-06-17 DIAGNOSIS — O10019 Pre-existing essential hypertension complicating pregnancy, unspecified trimester: Secondary | ICD-10-CM | POA: Diagnosis not present

## 2018-06-17 LAB — URIC ACID: URIC ACID, SERUM: 3.8 mg/dL (ref 2.5–7.1)

## 2018-06-17 LAB — PROTEIN / CREATININE RATIO, URINE
Creatinine, Urine: 38 mg/dL
PROTEIN CREATININE RATIO: 0.21 mg/mg{creat} — AB (ref 0.00–0.15)
Total Protein, Urine: 8 mg/dL

## 2018-06-17 LAB — COMPREHENSIVE METABOLIC PANEL
ALBUMIN: 3.2 g/dL — AB (ref 3.5–5.0)
ALT: 22 U/L (ref 0–44)
AST: 19 U/L (ref 15–41)
Alkaline Phosphatase: 199 U/L — ABNORMAL HIGH (ref 38–126)
Anion gap: 9 (ref 5–15)
BUN: 9 mg/dL (ref 6–20)
CHLORIDE: 105 mmol/L (ref 98–111)
CO2: 21 mmol/L — AB (ref 22–32)
Calcium: 8.8 mg/dL — ABNORMAL LOW (ref 8.9–10.3)
Creatinine, Ser: 0.57 mg/dL (ref 0.44–1.00)
GFR calc Af Amer: >60 mL/min (ref >60)
GFR calc non Af Amer: >60 mL/min (ref >60)
GLUCOSE: 70 mg/dL (ref 70–99)
POTASSIUM: 4.1 mmol/L (ref 3.5–5.1)
SODIUM: 135 mmol/L (ref 135–145)
TOTAL PROTEIN: 6.3 g/dL — AB (ref 6.5–8.1)
Total Bilirubin: 0.4 mg/dL (ref 0.3–1.2)

## 2018-06-17 LAB — LACTATE DEHYDROGENASE: LDH: 141 U/L (ref 98–192)

## 2018-06-17 LAB — CBC
HEMATOCRIT: 36.2 % (ref 36.0–46.0)
HEMOGLOBIN: 12.6 g/dL (ref 12.0–15.0)
MCH: 32.2 pg (ref 26.0–34.0)
MCHC: 34.8 g/dL (ref 30.0–36.0)
MCV: 92.6 fL (ref 80.0–100.0)
NRBC: 0 % (ref 0.0–0.2)
Platelets: 187 10*3/uL (ref 150–400)
RBC: 3.91 MIL/uL (ref 3.87–5.11)
RDW: 13.2 % (ref 11.5–15.5)
WBC: 8.3 10*3/uL (ref 4.0–10.5)

## 2018-06-17 LAB — TYPE AND SCREEN
ABO/RH(D): O POS
ANTIBODY SCREEN: NEGATIVE

## 2018-06-17 MED ORDER — LACTATED RINGERS IV SOLN
INTRAVENOUS | Status: DC
  Administered 2018-06-17 – 2018-06-18 (×3): via INTRAVENOUS

## 2018-06-17 MED ORDER — MISOPROSTOL 25 MCG QUARTER TABLET
25.0000 ug | ORAL_TABLET | ORAL | Status: DC | PRN
  Administered 2018-06-17: 25 ug via VAGINAL
  Filled 2018-06-17 (×2): qty 1

## 2018-06-17 MED ORDER — OXYTOCIN BOLUS FROM INFUSION
500.0000 mL | Freq: Once | INTRAVENOUS | Status: AC
  Administered 2018-06-18: 500 mL via INTRAVENOUS

## 2018-06-17 MED ORDER — HYDRALAZINE HCL 20 MG/ML IJ SOLN: 10.0000 mg | INTRAMUSCULAR | Status: DC | PRN

## 2018-06-17 MED ORDER — OXYTOCIN 40 UNITS IN LACTATED RINGERS INFUSION - SIMPLE MED
2.5000 [IU]/h | INTRAVENOUS | Status: DC
  Administered 2018-06-18: 2.5 [IU]/h via INTRAVENOUS
  Filled 2018-06-17: qty 1000

## 2018-06-17 MED ORDER — SOD CITRATE-CITRIC ACID 500-334 MG/5ML PO SOLN: 30.0000 mL | ORAL | Status: DC | PRN

## 2018-06-17 MED ORDER — DIPHENHYDRAMINE HCL 50 MG/ML IJ SOLN: 12.5000 mg | INTRAMUSCULAR | Status: DC | PRN

## 2018-06-17 MED ORDER — LABETALOL HCL 100 MG PO TABS
100.0000 mg | ORAL_TABLET | Freq: Two times a day (BID) | ORAL | Status: DC
  Administered 2018-06-17 – 2018-06-20 (×6): 100 mg via ORAL
  Filled 2018-06-17 (×6): qty 1

## 2018-06-17 MED ORDER — SODIUM CHLORIDE 0.9 % IV SOLN
5.0000 10*6.[IU] | Freq: Once | INTRAVENOUS | Status: AC
  Administered 2018-06-17: 5 10*6.[IU] via INTRAVENOUS
  Filled 2018-06-17: qty 5

## 2018-06-17 MED ORDER — LABETALOL HCL 5 MG/ML IV SOLN: 80.0000 mg | INTRAVENOUS | Status: DC | PRN

## 2018-06-17 MED ORDER — EPHEDRINE 5 MG/ML INJ
10.0000 mg | INTRAVENOUS | Status: DC | PRN
  Filled 2018-06-17: qty 2

## 2018-06-17 MED ORDER — LACTATED RINGERS IV SOLN
500.0000 mL | Freq: Once | INTRAVENOUS | Status: AC
  Administered 2018-06-17: 500 mL via INTRAVENOUS

## 2018-06-17 MED ORDER — TERBUTALINE SULFATE 1 MG/ML IJ SOLN
0.2500 mg | Freq: Once | INTRAMUSCULAR | Status: DC | PRN
  Filled 2018-06-17: qty 1

## 2018-06-17 MED ORDER — LABETALOL HCL 5 MG/ML IV SOLN: 20.0000 mg | INTRAVENOUS | Status: DC | PRN

## 2018-06-17 MED ORDER — FENTANYL 2.5 MCG/ML BUPIVACAINE 1/10 % EPIDURAL INFUSION (WH - ANES)
14.0000 mL/h | INTRAMUSCULAR | Status: DC | PRN
  Administered 2018-06-17: 14 mL/h via EPIDURAL

## 2018-06-17 MED ORDER — PHENYLEPHRINE 40 MCG/ML (10ML) SYRINGE FOR IV PUSH (FOR BLOOD PRESSURE SUPPORT)
80.0000 ug | PREFILLED_SYRINGE | INTRAVENOUS | Status: DC | PRN
  Filled 2018-06-17: qty 5

## 2018-06-17 MED ORDER — LIDOCAINE HCL (PF) 1 % IJ SOLN
30.0000 mL | INTRAMUSCULAR | Status: DC | PRN
  Filled 2018-06-17: qty 30

## 2018-06-17 MED ORDER — FENTANYL 2.5 MCG/ML BUPIVACAINE 1/10 % EPIDURAL INFUSION (WH - ANES)
INTRAMUSCULAR | Status: AC
  Filled 2018-06-17: qty 100

## 2018-06-17 MED ORDER — LACTATED RINGERS IV SOLN
500.0000 mL | INTRAVENOUS | Status: DC | PRN
  Administered 2018-06-17 – 2018-06-18 (×2): 500 mL via INTRAVENOUS

## 2018-06-17 MED ORDER — ONDANSETRON HCL 4 MG/2ML IJ SOLN: 4.0000 mg | Freq: Four times a day (QID) | INTRAMUSCULAR | Status: DC | PRN

## 2018-06-17 MED ORDER — PENICILLIN G 3 MILLION UNITS IVPB - SIMPLE MED
3.0000 10*6.[IU] | INTRAVENOUS | Status: DC
  Administered 2018-06-17: 3 10*6.[IU] via INTRAVENOUS
  Filled 2018-06-17 (×3): qty 100

## 2018-06-17 MED ORDER — LACTATED RINGERS AMNIOINFUSION
INTRAVENOUS | Status: DC
  Administered 2018-06-17: 22:00:00 via INTRAUTERINE
  Filled 2018-06-17 (×2): qty 1000

## 2018-06-17 MED ORDER — OXYTOCIN 40 UNITS IN LACTATED RINGERS INFUSION - SIMPLE MED: 1.0000 m[IU]/min | INTRAVENOUS | Status: DC

## 2018-06-17 MED ORDER — PHENYLEPHRINE 40 MCG/ML (10ML) SYRINGE FOR IV PUSH (FOR BLOOD PRESSURE SUPPORT)
PREFILLED_SYRINGE | INTRAVENOUS | Status: AC
  Filled 2018-06-17: qty 20

## 2018-06-17 MED ORDER — LABETALOL HCL 5 MG/ML IV SOLN: 40.0000 mg | INTRAVENOUS | Status: DC | PRN

## 2018-06-17 MED ORDER — LIDOCAINE HCL (PF) 1 % IJ SOLN
INTRAMUSCULAR | Status: DC | PRN
  Administered 2018-06-17: 6 mL via EPIDURAL
  Administered 2018-06-17: 7 mL via EPIDURAL

## 2018-06-17 MED ORDER — ACETAMINOPHEN 325 MG PO TABS
650.0000 mg | ORAL_TABLET | ORAL | Status: DC | PRN
  Administered 2018-06-19: 650 mg via ORAL
  Filled 2018-06-17: qty 2

## 2018-06-17 NOTE — Progress Notes (Signed)
Kristi Richmond is a 27 y.o. G2P1001 at [redacted]w[redacted]d by LMP admitted for induction of labor due to Hypertension.  Subjective: Patient with some mild cramping, not severe  Objective: BP (!) 147/85   Pulse 87   Temp 98.6 F (37 C) (Oral)   Resp 16   Ht 5' 5.5" (1.664 m)   Wt 85.6 kg   LMP  (LMP Unknown) Comment: recent pregnancy, no period since, denies pregnancy  SpO2 98%   BMI 30.94 kg/m  No intake/output data recorded. No intake/output data recorded.  FHT:  FHR: 145 bpm, variability: moderate,  accelerations:  Present,  decelerations:  Present deep variable decels after contractions nadir 90's with return to baseline UC:   irregular, every 4-6 minutes SVE:   Dilation: 2 Effacement (%): 50 Station: -2 Exam by:: Dr. Mora Appl AROM: clear fluid  Labs: Lab Results  Component Value Date   WBC 8.3 06/17/2018   HGB 12.6 06/17/2018   HCT 36.2 06/17/2018   MCV 92.6 06/17/2018   PLT 187 06/17/2018    Assessment / Plan: Induction of labor due to chronic hypertension,  Cat II tracing AROM performed to augment labor.  Will start pitocin if Fetal heart tracing improves.  Patient will get epidural now, in case emergent cesarean section needs to be performed  Labor: Early stage Preeclampsia:  no signs or symptoms of toxicity Fetal Wellbeing:  Category II Pain Control:  Labor support without medications I/D:  n/a Anticipated MOD:  unknown  Essie Hart STACIA 06/17/2018, 9:36 PM

## 2018-06-17 NOTE — Anesthesia Procedure Notes (Signed)
Epidural Patient location during procedure: OB Start time: 06/17/2018 10:10 PM End time: 06/17/2018 10:13 PM  Staffing Anesthesiologist: Leilani Able, MD Performed: anesthesiologist   Preanesthetic Checklist Completed: patient identified, site marked, surgical consent, pre-op evaluation, timeout performed, IV checked, risks and benefits discussed and monitors and equipment checked  Epidural Patient position: right lateral decubitus Prep: site prepped and draped and DuraPrep Patient monitoring: continuous pulse ox and blood pressure Approach: midline Location: L3-L4 Injection technique: LOR air  Needle:  Needle type: Tuohy  Needle gauge: 17 G Needle length: 9 cm and 9 Needle insertion depth: 8 cm Catheter type: closed end flexible Catheter size: 19 Gauge Catheter at skin depth: 13 cm Test dose: negative and Other  Assessment Sensory level: T9 Events: blood not aspirated, injection not painful, no injection resistance, negative IV test and no paresthesia  Additional Notes Reason for block:procedure for pain

## 2018-06-17 NOTE — Anesthesia Pain Management Evaluation Note (Signed)
  CRNA Pain Management Visit Note  Patient: Kristi Richmond, 27 y.o., female  "Hello I am a member of the anesthesia team at [facility]. We have an anesthesia team available at all times to provide care throughout the hospital, including epidural management and anesthesia for C-section. I don't know your plan for the delivery whether it a natural birth, water birth, IV sedation, nitrous supplementation, doula or epidural, but we want to meet your pain goals."   1.Was your pain managed to your expectations on prior hospitalizations?   Yes   2.What is your expectation for pain management during this hospitalization?     Epidural  3.How can we help you reach that goal? epidural  Record the patient's initial score and the patient's pain goal.   Pain: 0  Pain Goal: 5 [facility] wants you to be able to say your pain was always managed very well.  Denica Web 06/17/2018

## 2018-06-17 NOTE — Progress Notes (Addendum)
S: Pt in room in stable condition . I assumed care for pt. Dr Mora Appl verbalized she will be specialing this pt and wants to be called for the delivery. Dr Mora Appl verbailized she would do the H&P on pt. [id number], 28ys, G2P1001 @ 38 weeks was seeming the clinic today with h/o CHTN during pregnancy , mostly controlled with labetalol 100mg  BID, pt had severe range BP of 180/100 in office and was sent as a direct admit for IOL. Pt is table, denies vision changes, HA< or epigastric pain, pt +FM, denies cxt, leakage of fluid or vaginal bleeding. GBS+. EFW on [place] 10/15 was 6lbs, 8oz. BPP 8/8 on 10/15.  O: Patient Vitals for the past 24 hrs:  BP Temp Temp src Pulse Resp Height Weight  06/17/18 1851 - - - - 18 - -  06/17/18 1741 117/76 - - 76 - - -  06/17/18 1732 - - - - 20 - -  06/17/18 1711 - - - - - 5' 5.5" (1.664 m) 85.6 kg  06/17/18 1703 (!) 148/88 98.4 F (36.9 C) Oral 82 18 - -  SVE: Closed/20/Ball NST: Reactive, Cat 1, +acels, -decels. Baseline 150s with moderate variability.  Cxt 4-5, mild to palpate, lasting 60 seconds Leopolds: Vertex, 6.5lbs.  A/P:  CHTN: IOL, start with cytotec. Asymptomatic, pending Pre E labs, labetalol protocol PRN, monitor BP, conmtinue labetalol 100mg  BID. FWB: Cat 1  Dr Mora Appl aware.   [place] CNM, FNP 7:17 PM Late Entry  06/17/18

## 2018-06-17 NOTE — Anesthesia Preprocedure Evaluation (Signed)
Anesthesia Evaluation  Patient identified by MRN, date of birth, ID band Patient awake    Reviewed: Allergy & Precautions, H&P , NPO status , Patient's Chart, lab work & pertinent test results, reviewed documented beta blocker date and time   Airway Mallampati: I  TM Distance: >3 FB Neck ROM: full    Dental no notable dental hx. (+) Teeth Intact   Pulmonary neg pulmonary ROS,    Pulmonary exam normal breath sounds clear to auscultation       Cardiovascular hypertension, Pt. on home beta blockers negative cardio ROS Normal cardiovascular exam Rhythm:regular Rate:Normal     Neuro/Psych negative neurological ROS  negative psych ROS   GI/Hepatic negative GI ROS, Neg liver ROS,   Endo/Other  negative endocrine ROS  Renal/GU negative Renal ROS  negative genitourinary   Musculoskeletal negative musculoskeletal ROS (+)   Abdominal Normal abdominal exam  (+)   Peds  Hematology negative hematology ROS (+)   Anesthesia Other Findings   Reproductive/Obstetrics (+) Pregnancy                             Anesthesia Physical Anesthesia Plan  ASA: II  Anesthesia Plan: Epidural   Post-op Pain Management:    Induction:   PONV Risk Score and Plan:   Airway Management Planned:   Additional Equipment:   Intra-op Plan:   Post-operative Plan:   Informed Consent: I have reviewed the patients History and Physical, chart, labs and discussed the procedure including the risks, benefits and alternatives for the proposed anesthesia with the patient or authorized representative who has indicated his/her understanding and acceptance.     Plan Discussed with:   Anesthesia Plan Comments:         Anesthesia Quick Evaluation  

## 2018-06-17 NOTE — H&P (Signed)
Kristi Richmond is a 27 y.o. female G2P1001 at [redacted] weeks GA presenting for induction of labor (sent from office). Patient with chronic hypertension on labetalol po.  Patient with severe range BP's in office today BP max 180 / 110 Patient w/o s/sx of superimposed preeclampsia. No HA, no RUQ pain, no blurry vision or scotomata  OB History    Gravida  2   Para  1   Term  1   Preterm  0   AB  0   Living  1     SAB  0   TAB  0   Ectopic      Multiple  0   Live Births  1          Past Medical History:  Diagnosis Date  . Cholelithiasis 03/2017  . Chronic cholecystitis 03/2017  . Hypertension    states under control with med., has been on med. since age 70   Past Surgical History:  Procedure Laterality Date  . CHOLECYSTECTOMY    . CHOLECYSTECTOMY N/A 04/16/2017   Procedure: LAPAROSCOPIC CHOLECYSTECTOMY WITH INTRAOPERATIVE CHOLANGIOGRAM;  Surgeon: Darnell Level, MD;  Location: [facility];  Service: General;  Laterality: N/A;  . DERMOID CYST  EXCISION Left 12/19/2003   post-auricular  . ERCP N/A 04/17/2017   Procedure: ENDOSCOPIC RETROGRADE CHOLANGIOPANCREATOGRAPHY (ERCP);  Surgeon: Vida Rigger, MD;  Location: [facility];  Service: Endoscopy;  Laterality: N/A;  . WISDOM TOOTH EXTRACTION  2008   Family History: family history includes Asthma in her brother; Diabetes in her father; Hypertension in her mother. Social History:  reports that she has never smoked. She has never used smokeless tobacco. She reports that she does not drink alcohol or use drugs.     Maternal Diabetes: No Genetic Screening: Normal Maternal Ultrasounds/Referrals: Normal Fetal Ultrasounds or other Referrals:  Other: BPP's weekly 8/8 Maternal Substance Abuse:  No Significant Maternal Medications:  Meds include: Other: Labetalol 200 mg daily Significant Maternal Lab Results:  Lab values include: Group B Strep positive Other Comments:  None  ROS  As per HPI Maternal Medical History:   Fetal activity: Perceived fetal activity is normal.   Last perceived fetal movement was within the past hour.    Prenatal complications: PIH.   Prenatal Complications - Diabetes: none.    Dilation: 2 Effacement (%): 50 Station: -2 Exam by:: Dr. Mora Appl Blood pressure (!) 147/85, pulse 87, temperature 98.6 F (37 C), temperature source Oral, resp. rate 16, height 5' 5.5" (1.664 m), weight 85.6 kg, SpO2 98 %, currently breastfeeding. Maternal Exam:  Uterine Assessment: Contraction strength is mild.  Contraction frequency is irregular.   Abdomen: Patient reports no abdominal tenderness. Fundal height is 38 cm.   Estimated fetal weight is 3200 grams.   Fetal presentation: vertex  Introitus: Normal vulva. Normal vagina.  Cervix: Cervix evaluated by digital exam.   In office Long / closed/ posterior  Fetal Exam Fetal Monitor Review: Baseline rate: 145.  Variability: moderate (6-25 bpm).   Pattern: no decelerations and accelerations present.    Fetal State Assessment: Category I - tracings are normal.     Physical Exam  Prenatal labs: ABO, Rh: --/--/O POS (10/24 1718) Antibody: NEG (10/24 1718) Rubella: Immune (03/27 0000) RPR: Nonreactive (03/27 0000)  HBsAg: Negative (03/27 0000)  HIV: Non-reactive (03/27 0000)  GBS: Positive (10/09 0000)   Assessment/Plan: 27 yo G2P1 with chronic hypertension no s/sx of superimposed preeclampsia Admit to L&D for induction of labor Continuous monitoring PIH labs  Epidural on demand Misoprostol then pitocin  Philana Younis STACIA 06/17/2018, 9:39 PM

## 2018-06-17 NOTE — Progress Notes (Addendum)
Labor Progress Note  Kristi Richmond, 27 y.o., G2P1001, with an IUP @ [redacted]w[redacted]d, presented for IOL for [facility] was controlled on labetalol 100mg  BID, in [facility] office had elevated BP in severe range. GBS+, EFW 6lb 8oz.   Subjective: Pt was resting sitting ib ed with mother, was called by RN for late decels that were noted at 1922 with baseline of 150s and nadir of decel at 120, decels continued to occur with a mix of variable and lates, Dr Mora Appl noted and on her way in to the hospital. RN repositioned, gave x2 boluses. Pt endorses +FM.  Patient Active Problem List   Diagnosis Date Noted  . Chronic hypertension affecting pregnancy 06/17/2018  . Cholelithiasis with chronic cholecystitis 04/16/2017  . Chronic benign essential hypertension, antepartum 12/01/2016   Objective: BP (!) 147/85   Pulse 87   Temp 98.6 F (37 C) (Oral)   Resp 16   Ht 5' 5.5" (1.664 m)   Wt 85.6 kg   LMP  (LMP Unknown) Comment: recent pregnancy, no period since, denies pregnancy  SpO2 98%   BMI 30.94 kg/m  No intake/output data recorded. No intake/output data recorded. NST: FHR baseline 150 bpm, Variability: moderate, Accelerations:present, Decelerations:  Present Lates and decels lnoted with nadir of decel ranging from 70-120 bmp. = Cat 2/Reactive CTX:  irregular, every 1.5-3 minutes, moderate, lasting 60 seconds.  Uterus gravid, soft non tender, moderate to palpate with contractions.  SVE:  Dilation: 1 Effacement (%): 50 Station: -3 Exam by:: [facility], CNM  Bedside US verified vertex.   Assessment:  Kristi Richmond, 27 y.o., G2P1001, with an IUP @ [redacted]w[redacted]d, presented for IOL for [facility] was controlled on labetalol 100mg  BID, in [facility] office had elevated BP in severe range. GBS+, EFW 6lb 8oz. Admission PreE labs were negative.  Decels noted, relieved with position changed, but lates and variables continue to occur. Dr Mora Appl consulted and on her way in to care over care of pt.  Patient Active Problem List   Diagnosis Date Noted  . Chronic hypertension affecting pregnancy 06/17/2018  . Cholelithiasis with chronic cholecystitis 04/16/2017  . Chronic benign essential hypertension, antepartum 12/01/2016   NICHD: Category 2  Category 2 with active intrauterine resuscitative measures 1000mg  IV bolus and maternal repositioning.   Membranes:  Intact, no s/s of infection  Induction:    Cytotec x1 @ 1730  Foley Bulb: No  Pitocin - Not yet.   Pain management:               IV pain management: PRN  Nitrous: PRN             Epidural placement: PRN  GBS Positive  Abx: x1 penicillin, 2nd dose due in 15 mins.   Plan: Continue labor plan Continuous monitoring Rest/Ambulate/Frequent position changes to facilitate fetal rotation and descent. Dr Mora Appl on her way in to take over care of pt, report given.  Orders to hold and place 2nd cytotec.  Anticipate labor progression and vaginal delivery.  Continue active intrauterine resuscitative measures PRN.   Dale Kremlin, NP-C, CNM, MSN 06/17/2018. 9:41 PM Late Entry

## 2018-06-17 NOTE — Progress Notes (Signed)
Kristi Richmond is a 27 y.o. G2P1001 at [redacted]w[redacted]d by LMP admitted for induction of labor due to Hypertension.  Subjective: Patient comfortable now with epidural  Objective: BP (!) 135/92   Pulse 88   Temp 98.6 F (37 C) (Oral)   Resp 16   Ht 5' 5.5" (1.664 m)   Wt 85.6 kg   LMP  (LMP Unknown) Comment: recent pregnancy, no period since, denies pregnancy  SpO2 98%   BMI 30.94 kg/m  No intake/output data recorded. No intake/output data recorded.  FHT:  FHR: 145 bpm, variability: moderate,  accelerations:  Present,  decelerations:  Present deep variable decels nadir 90's lasting 30 second with return to baseline UC:   irregular, every 5-6 minutes SVE:   Dilation: 5.5 Effacement (%): 80 Station: -1 Exam by:: Dr. Mora Appl  Labs: Lab Results  Component Value Date   WBC 8.3 06/17/2018   HGB 12.6 06/17/2018   HCT 36.2 06/17/2018   MCV 92.6 06/17/2018   PLT 187 06/17/2018    Assessment / Plan: Induction of labor due to chronic hypertension,  progressing well on pitocin Cat II tracing. Patient on oxygen now.  Starting amnioinfusion to help with deep variables  Labor: Progressing normally Preeclampsia:  no signs or symptoms of toxicity Fetal Wellbeing:  Category II Pain Control:  Epidural I/D:  n/a Anticipated MOD:  NSVD or operative vaginal delivery  Kristi Richmond Kristi Richmond 06/17/2018, 10:25 PM

## 2018-06-18 ENCOUNTER — Encounter (HOSPITAL_COMMUNITY): Payer: Self-pay

## 2018-06-18 LAB — CBC
HCT: 34.4 % — ABNORMAL LOW (ref 36.0–46.0)
HEMOGLOBIN: 12.1 g/dL (ref 12.0–15.0)
MCH: 32.2 pg (ref 26.0–34.0)
MCHC: 35.2 g/dL (ref 30.0–36.0)
MCV: 91.5 fL (ref 80.0–100.0)
NRBC: 0 % (ref 0.0–0.2)
Platelets: 174 10*3/uL (ref 150–400)
RBC: 3.76 MIL/uL — AB (ref 3.87–5.11)
RDW: 13.1 % (ref 11.5–15.5)
WBC: 10.7 10*3/uL — ABNORMAL HIGH (ref 4.0–10.5)

## 2018-06-18 LAB — RPR: RPR Ser Ql: NONREACTIVE

## 2018-06-18 MED ORDER — TETANUS-DIPHTH-ACELL PERTUSSIS 5-2.5-18.5 LF-MCG/0.5 IM SUSP: 0.5000 mL | Freq: Once | INTRAMUSCULAR | Status: DC

## 2018-06-18 MED ORDER — INFLUENZA VAC SPLIT QUAD 0.5 ML IM SUSY
0.5000 mL | PREFILLED_SYRINGE | INTRAMUSCULAR | Status: AC
  Administered 2018-06-20: 0.5 mL via INTRAMUSCULAR

## 2018-06-18 MED ORDER — DIPHENHYDRAMINE HCL 25 MG PO CAPS: 25.0000 mg | ORAL_CAPSULE | Freq: Four times a day (QID) | ORAL | Status: DC | PRN

## 2018-06-18 MED ORDER — ACETAMINOPHEN 325 MG PO TABS: 650.0000 mg | ORAL_TABLET | ORAL | Status: DC | PRN

## 2018-06-18 MED ORDER — ZOLPIDEM TARTRATE 5 MG PO TABS: 5.0000 mg | ORAL_TABLET | Freq: Every evening | ORAL | Status: DC | PRN

## 2018-06-18 MED ORDER — OXYCODONE-ACETAMINOPHEN 5-325 MG PO TABS: 1.0000 | ORAL_TABLET | ORAL | Status: DC | PRN

## 2018-06-18 MED ORDER — ONDANSETRON HCL 4 MG PO TABS: 4.0000 mg | ORAL_TABLET | ORAL | Status: DC | PRN

## 2018-06-18 MED ORDER — BENZOCAINE-MENTHOL 20-0.5 % EX AERO: 1.0000 "application " | INHALATION_SPRAY | CUTANEOUS | Status: DC | PRN

## 2018-06-18 MED ORDER — DIBUCAINE 1 % RE OINT: 1.0000 "application " | TOPICAL_OINTMENT | RECTAL | Status: DC | PRN

## 2018-06-18 MED ORDER — PRENATAL MULTIVITAMIN CH
1.0000 | ORAL_TABLET | Freq: Every day | ORAL | Status: DC
  Administered 2018-06-18 – 2018-06-20 (×3): 1 via ORAL
  Filled 2018-06-18 (×3): qty 1

## 2018-06-18 MED ORDER — WITCH HAZEL-GLYCERIN EX PADS: 1.0000 "application " | MEDICATED_PAD | CUTANEOUS | Status: DC | PRN

## 2018-06-18 MED ORDER — IBUPROFEN 600 MG PO TABS
600.0000 mg | ORAL_TABLET | Freq: Four times a day (QID) | ORAL | Status: DC
  Administered 2018-06-18 – 2018-06-20 (×10): 600 mg via ORAL
  Filled 2018-06-18 (×10): qty 1

## 2018-06-18 MED ORDER — ONDANSETRON HCL 4 MG/2ML IJ SOLN: 4.0000 mg | INTRAMUSCULAR | Status: DC | PRN

## 2018-06-18 MED ORDER — SENNOSIDES-DOCUSATE SODIUM 8.6-50 MG PO TABS
2.0000 | ORAL_TABLET | ORAL | Status: DC
  Administered 2018-06-19: 2 via ORAL
  Filled 2018-06-18 (×2): qty 2

## 2018-06-18 MED ORDER — COCONUT OIL OIL
1.0000 "application " | TOPICAL_OIL | Status: DC | PRN
  Administered 2018-06-19: 1 via TOPICAL
  Filled 2018-06-18: qty 120

## 2018-06-18 MED ORDER — LACTATED RINGERS IV SOLN: INTRAVENOUS | Status: DC

## 2018-06-18 MED ORDER — OXYCODONE-ACETAMINOPHEN 5-325 MG PO TABS: 2.0000 | ORAL_TABLET | ORAL | Status: DC | PRN

## 2018-06-18 MED ORDER — SIMETHICONE 80 MG PO CHEW: 80.0000 mg | CHEWABLE_TABLET | ORAL | Status: DC | PRN

## 2018-06-18 NOTE — Anesthesia Postprocedure Evaluation (Signed)
Anesthesia Post Note  Patient: Kristi Richmond  Procedure(s) Performed: AN AD HOC LABOR EPIDURAL     Patient location during evaluation: Mother Baby Anesthesia Type: Epidural Level of consciousness: awake Pain management: pain level controlled Vital Signs Assessment: post-procedure vital signs reviewed and stable Respiratory status: spontaneous breathing Cardiovascular status: stable Postop Assessment: patient able to bend at knees, no backache, no headache and epidural receding Anesthetic complications: no    Last Vitals:  Vitals:   06/18/18 0402 06/18/18 0506  BP: (!) 147/78 120/75  Pulse: 84 93  Resp: 18 18  Temp: 36.8 C 37 C  SpO2:  99%    Last Pain:  Vitals:   06/18/18 0506  TempSrc: Oral  PainSc:    Pain Goal:                 Edison Pace

## 2018-06-18 NOTE — Lactation Note (Signed)
This note was copied from a baby's chart. Lactation Consultation Note  Patient Name: Kristi Richmond [id number] Date: 06/18/2018 Reason for consult: Initial assessment;Early term 37-38.6wks;Infant < 6lbs Baby is 8 hours old and in the nursery for temperature instablility.  Baby is latching to breast and also been spoon fed colostrum twice.  Mom breastfed her first baby for 6 months and had an abundant milk supply. Instructed to feed with cues and post pump and hand express every 3 hours.  Symphony pump set up and initiated.  Encouraged mom to call with concerns/assist prn.   Maternal Data Has patient been taught Hand Expression?: Yes Does the patient have breastfeeding experience prior to this delivery?: Yes  Feeding Feeding Type: Breast Fed  LATCH Score                   Interventions    Lactation Tools Discussed/Used Pump Review: Setup, frequency, and cleaning;Milk Storage Initiated by:: LM Date initiated:: 06/19/18   Consult Status Consult Status: Follow-up Date: 06/19/18 Follow-up type: In-patient    Kristi Richmond 06/18/2018, 10:20 AM

## 2018-06-19 LAB — CBC
HCT: 34.1 % — ABNORMAL LOW (ref 36.0–46.0)
HEMOGLOBIN: 12.1 g/dL (ref 12.0–15.0)
MCH: 32.6 pg (ref 26.0–34.0)
MCHC: 35.5 g/dL (ref 30.0–36.0)
MCV: 91.9 fL (ref 80.0–100.0)
NRBC: 0 % (ref 0.0–0.2)
PLATELETS: 156 10*3/uL (ref 150–400)
RBC: 3.71 MIL/uL — AB (ref 3.87–5.11)
RDW: 13.2 % (ref 11.5–15.5)
WBC: 8.4 10*3/uL (ref 4.0–10.5)

## 2018-06-19 LAB — BIRTH TISSUE RECOVERY COLLECTION (PLACENTA DONATION)

## 2018-06-19 NOTE — Progress Notes (Signed)
Subjective: Postpartum Day 1: Vaginal delivery, none laceration Patient up ad lib, reports no syncope or dizziness. Feeding:  Breastfeeding Contraceptive plan: IUD  Objective: Vital signs in last 24 hours: Temp:  [97.7 F (36.5 C)-97.8 F (36.6 C)] 97.7 F (36.5 C) (10/26 0500) Pulse Rate:  [72-82] 72 (10/26 0500) Resp:  [16-18] 16 (10/26 0500) BP: (123-141)/(75-89) 128/89 (10/26 0500)  Physical Exam:  General: alert, cooperative and no distress Lochia: appropriate Uterine Fundus: firm Perineum: N/A DVT Evaluation: No evidence of DVT seen on physical exam.   CBC Latest Ref Rng & Units 06/19/2018 06/18/2018 06/17/2018  WBC 4.0 - 10.5 K/uL 8.4 10.7(H) 8.3  Hemoglobin 12.0 - 15.0 g/dL 16.1 09.6 04.5  Hematocrit 36.0 - 46.0 % 34.1(L) 34.4(L) 36.2  Platelets 150 - 400 K/uL 156 174 187     Assessment/Plan: Status post vaginal delivery day 1. Stable Continue current care. Plan for discharge tomorrow, Breastfeeding and Lactation consult    Henderson Newcomer ProtheroCNM 06/19/2018, 10:53 AM

## 2018-06-19 NOTE — Lactation Note (Signed)
This note was copied from a baby's chart. Lactation Consultation Note  Patient Name: Kristi Richmond [id number] Date: 06/19/2018 Reason for consult: Follow-up assessment;Early term 76-38.6wks  P2 mother whose infant is now 25 hours old. This is an ETI at 38+1 weeks weighing < 6 lbs.  Baby was sleeping on mother's chest when I arrived.  Mother feels like breast feeding is going well and she has no questions/concerns at this time.  Her nipples are sensitive with no breakdown.  Comfort gels with instructions for use provided.  Encouraged to continue feeding 8-12 times/24 hours.  Mother is supplementing with Sim 22 calorie formula with appropriate amounts.  Mother will call for latch assistance as needed.  Father present.     Maternal Data Formula Feeding for Exclusion: No Has patient been taught Hand Expression?: Yes Does the patient have breastfeeding experience prior to this delivery?: Yes  Feeding Feeding Type: Bottle Fed - Formula  LATCH Score                   Interventions    Lactation Tools Discussed/Used Initiated by:: Already initiated   Consult Status Consult Status: Follow-up Date: 06/20/18 Follow-up type: In-patient    Dora Sims 06/19/2018, 6:57 PM

## 2018-06-20 MED ORDER — IBUPROFEN 600 MG PO TABS: 600.0000 mg | ORAL_TABLET | Freq: Four times a day (QID) | ORAL | 0 refills | Status: DC

## 2018-06-20 NOTE — Discharge Summary (Addendum)
  Obstetric Discharge Summary Reason for Admission: induction of labor for chronic HTN with severe range blood pressures at [redacted] weeks  EGA.  Prenatal Procedures: ultrasound Intrapartum Procedures: spontaneous vaginal delivery Postpartum Procedures: none Complications-Operative and Postpartum: none Hemoglobin  Date Value Ref Range Status  06/19/2018 12.1 12.0 - 15.0 g/dL Final   HCT  Date Value Ref Range Status  06/19/2018 34.1 (L) 36.0 - 46.0 % Final    Physical Exam:  General: alert, cooperative and no distress Lochia: appropriate Uterine Fundus: firm Incision:None DVT Evaluation: No evidence of DVT seen on physical exam.  Discharge Diagnoses: Term Pregnancy-delivered  Discharge Information: Date: 06/20/2018 Activity: pelvic rest Diet: routine Medications: PNV, Ibuprofen and Labetalol Condition: stable Instructions: refer to practice specific booklet and See Discharge Instructions.  Discharge to: home Follow-up Information    [facility]. Schedule an appointment as soon as possible for a visit in 6 week(s).   Contact information: [address] [place] [place] [address] [phone]        -Visiting Nurse service to follow up with patient in 1 week for a BP check.   Newborn Data: Live born female  Birth Weight: 5 lb 12.1 oz (2610 g) APGAR: 9, 9  Newborn Delivery   Birth date/time:  06/18/2018 02:01:00 Delivery type:  Vaginal, Spontaneous     Home with mother.  Konrad Felix , MD.  06/20/2018, 1:43 PM

## 2018-06-20 NOTE — Lactation Note (Signed)
This note was copied from a baby's chart. Lactation Consultation Note: Experienced BF mom reports baby has been latching on well, nipples were a little tender but better today. Has coconut oil and comfort gels. Using only coconut oil at present. Has been giving bottles of formula also because baby was still acting hungry after nursing and she was not sure how much she is getting. Reports breasts feel fuller this morning and she was able to pump 2 oz this morning. Asking about milk storage- guidelines reviewed with mom. No questions at present. To call prn  Patient Name: Kristi Richmond [id number] Date: 06/20/2018 Reason for consult: Follow-up assessment   Maternal Data Formula Feeding for Exclusion: No Has patient been taught Hand Expression?: Yes Does the patient have breastfeeding experience prior to this delivery?: Yes  Feeding Feeding Type: Breast Fed  LATCH Score Latch: Repeated attempts needed to sustain latch, nipple held in mouth throughout feeding, stimulation needed to elicit sucking reflex.  Audible Swallowing: A few with stimulation  Type of Nipple: Everted at rest and after stimulation  Comfort (Breast/Nipple): Soft / non-tender  Hold (Positioning): No assistance needed to correctly position infant at breast.  LATCH Score: 8  Interventions    Lactation Tools Discussed/Used     Consult Status Consult Status: Complete    Pamelia Hoit 06/20/2018, 9:08 AM

## 2018-06-25 ENCOUNTER — Inpatient Hospital Stay (HOSPITAL_COMMUNITY): Admission: RE | Admit: 2018-06-25 | Payer: 59 | Source: Ambulatory Visit

## 2018-08-10 DIAGNOSIS — Z30430 Encounter for insertion of intrauterine contraceptive device: Secondary | ICD-10-CM | POA: Diagnosis not present

## 2018-08-10 DIAGNOSIS — Z3040 Encounter for surveillance of contraceptives, unspecified: Secondary | ICD-10-CM | POA: Diagnosis not present

## 2018-09-09 DIAGNOSIS — Z30431 Encounter for routine checking of intrauterine contraceptive device: Secondary | ICD-10-CM | POA: Diagnosis not present

## 2018-12-14 DIAGNOSIS — I10 Essential (primary) hypertension: Secondary | ICD-10-CM | POA: Diagnosis not present

## 2019-02-16 DIAGNOSIS — Z6828 Body mass index (BMI) 28.0-28.9, adult: Secondary | ICD-10-CM | POA: Diagnosis not present

## 2019-02-16 DIAGNOSIS — Z0000 Encounter for general adult medical examination without abnormal findings: Secondary | ICD-10-CM | POA: Diagnosis not present

## 2019-02-16 DIAGNOSIS — Z113 Encounter for screening for infections with a predominantly sexual mode of transmission: Secondary | ICD-10-CM | POA: Diagnosis not present

## 2019-02-16 DIAGNOSIS — I10 Essential (primary) hypertension: Secondary | ICD-10-CM | POA: Diagnosis not present

## 2019-03-01 ENCOUNTER — Telehealth: Payer: BLUE CROSS/BLUE SHIELD | Admitting: Family

## 2019-03-01 DIAGNOSIS — N760 Acute vaginitis: Secondary | ICD-10-CM | POA: Diagnosis not present

## 2019-03-01 MED ORDER — METRONIDAZOLE 500 MG PO TABS: 500.0000 mg | ORAL_TABLET | Freq: Two times a day (BID) | ORAL | 0 refills | Status: DC

## 2019-03-01 NOTE — Progress Notes (Signed)

## 2019-07-16 ENCOUNTER — Telehealth: Payer: BLUE CROSS/BLUE SHIELD | Admitting: Nurse Practitioner

## 2019-07-16 DIAGNOSIS — B9689 Other specified bacterial agents as the cause of diseases classified elsewhere: Secondary | ICD-10-CM

## 2019-07-16 DIAGNOSIS — N760 Acute vaginitis: Secondary | ICD-10-CM

## 2019-07-16 MED ORDER — METRONIDAZOLE 500 MG PO TABS: 500.0000 mg | ORAL_TABLET | Freq: Two times a day (BID) | ORAL | 0 refills | Status: DC

## 2019-07-16 NOTE — Progress Notes (Signed)

## 2019-10-11 DIAGNOSIS — Z113 Encounter for screening for infections with a predominantly sexual mode of transmission: Secondary | ICD-10-CM | POA: Diagnosis not present

## 2019-10-11 DIAGNOSIS — N920 Excessive and frequent menstruation with regular cycle: Secondary | ICD-10-CM | POA: Diagnosis not present

## 2019-10-11 DIAGNOSIS — Z6829 Body mass index (BMI) 29.0-29.9, adult: Secondary | ICD-10-CM | POA: Diagnosis not present

## 2019-10-11 DIAGNOSIS — Z124 Encounter for screening for malignant neoplasm of cervix: Secondary | ICD-10-CM | POA: Diagnosis not present

## 2019-10-11 DIAGNOSIS — N930 Postcoital and contact bleeding: Secondary | ICD-10-CM | POA: Diagnosis not present

## 2019-10-11 DIAGNOSIS — Z01419 Encounter for gynecological examination (general) (routine) without abnormal findings: Secondary | ICD-10-CM | POA: Diagnosis not present

## 2019-11-08 DIAGNOSIS — Z6829 Body mass index (BMI) 29.0-29.9, adult: Secondary | ICD-10-CM | POA: Diagnosis not present

## 2019-11-08 DIAGNOSIS — N920 Excessive and frequent menstruation with regular cycle: Secondary | ICD-10-CM | POA: Diagnosis not present

## 2019-11-08 DIAGNOSIS — Z30431 Encounter for routine checking of intrauterine contraceptive device: Secondary | ICD-10-CM | POA: Diagnosis not present

## 2019-12-06 DIAGNOSIS — Z309 Encounter for contraceptive management, unspecified: Secondary | ICD-10-CM | POA: Diagnosis not present

## 2019-12-06 DIAGNOSIS — Z3202 Encounter for pregnancy test, result negative: Secondary | ICD-10-CM | POA: Diagnosis not present

## 2019-12-06 DIAGNOSIS — Z30017 Encounter for initial prescription of implantable subdermal contraceptive: Secondary | ICD-10-CM | POA: Diagnosis not present

## 2019-12-20 DIAGNOSIS — Z3202 Encounter for pregnancy test, result negative: Secondary | ICD-10-CM | POA: Diagnosis not present

## 2019-12-20 DIAGNOSIS — Z309 Encounter for contraceptive management, unspecified: Secondary | ICD-10-CM | POA: Diagnosis not present

## 2020-02-24 ENCOUNTER — Ambulatory Visit (INDEPENDENT_AMBULATORY_CARE_PROVIDER_SITE_OTHER): Payer: BC Managed Care – PPO | Admitting: Nurse Practitioner

## 2020-02-24 ENCOUNTER — Encounter: Payer: Self-pay | Admitting: Nurse Practitioner

## 2020-02-24 ENCOUNTER — Other Ambulatory Visit: Payer: Self-pay

## 2020-02-24 VITALS — BP 146/98 | HR 97 | Temp 96.9°F | Ht 66.03 in | Wt 182.0 lb

## 2020-02-24 DIAGNOSIS — I10 Essential (primary) hypertension: Secondary | ICD-10-CM

## 2020-02-24 DIAGNOSIS — Z6829 Body mass index (BMI) 29.0-29.9, adult: Secondary | ICD-10-CM | POA: Diagnosis not present

## 2020-02-24 DIAGNOSIS — E663 Overweight: Secondary | ICD-10-CM | POA: Diagnosis not present

## 2020-02-24 DIAGNOSIS — E785 Hyperlipidemia, unspecified: Secondary | ICD-10-CM | POA: Diagnosis not present

## 2020-02-24 MED ORDER — HYDROCHLOROTHIAZIDE 25 MG PO TABS: 25.0000 mg | ORAL_TABLET | Freq: Every day | ORAL | 0 refills | Status: DC

## 2020-02-24 NOTE — Progress Notes (Signed)
Careteam: Patient Care Team: Sharon Seller, NP as PCP - General (Geriatric Medicine) Darnell Level, MD as Consulting Physician (General Surgery) Philip Aspen, DO as Consulting Physician (Obstetrics and Gynecology)  PLACE OF SERVICE:  [facility]  Advanced Directive information    Allergies  Allergen Reactions  . Sulfa Antibiotics Hives    Chief Complaint  Patient presents with  . Establish Care    New patient establish care, discuss getting a refil on B/P medication      HPI: Patient is a 29 y.o. female to establish care.   Goes to GYN for pap/physical last was 10/11/2019. On nexplanon 3 year subdermal.   Hypertension- does not check blood pressure at home, takes at work and it is up and down. Always has had problems with bp. Taking HCTZ 25 mg for blood pressure, has been on since February.  Attempts low sodium diet. Does not add salt after she cooks.   Has a 31 year old and a 31 year old. Not breastfeeding or wanting to get pregnant at this time.   Hyperlipidemia- elevated but not to the point of medication.   Review of Systems:  Review of Systems  Constitutional: Negative for chills, fever and weight loss.  HENT: Negative for tinnitus.   Respiratory: Negative for cough, sputum production and shortness of breath.   Cardiovascular: Negative for chest pain, palpitations and leg swelling.  Gastrointestinal: Negative for abdominal pain, constipation, diarrhea and heartburn.  Genitourinary: Negative for dysuria, frequency and urgency.  Musculoskeletal: Negative for back pain, falls, joint pain and myalgias.  Skin: Negative.   Neurological: Negative for dizziness, weakness and headaches.  Psychiatric/Behavioral: Negative for depression and memory loss. The patient does not have insomnia.     Past Medical History:  Diagnosis Date  . Cholelithiasis 03/2017  . Chronic cholecystitis 03/2017  . Hypertension    states under control with med., has been on med. since  age 84   Past Surgical History:  Procedure Laterality Date  . CHOLECYSTECTOMY    . CHOLECYSTECTOMY N/A 04/16/2017   Procedure: LAPAROSCOPIC CHOLECYSTECTOMY WITH INTRAOPERATIVE CHOLANGIOGRAM;  Surgeon: Darnell Level, MD;  Location: [facility];  Service: General;  Laterality: N/A;  . DERMOID CYST  EXCISION Left 12/19/2003   post-auricular  . ERCP N/A 04/17/2017   Procedure: ENDOSCOPIC RETROGRADE CHOLANGIOPANCREATOGRAPHY (ERCP);  Surgeon: Vida Rigger, MD;  Location[facility] END[facility]COPY;  Service: Endoscopy;  Laterality: N/A;  . WISDOM TOOTH EXTRACTION  2008   Social History:   reports that she has never smoked. She has never used smokeless tobacco. She reports that she does not drink alcohol and does not use drugs.  Family History  Problem Relation Age of Onset  . Hypertension Mother   . Sleep apnea Mother   . Diabetes Father     Medications: Patient's Medications  New Prescriptions   No medications on file  Previous Medications   ETONOGESTREL (NEXPLANON) 68 MG IMPL IMPLANT    1 each by Subdermal route once. Left arm   HYDROCHLOROTHIAZIDE (HYDRODIURIL) 25 MG TABLET    Take 25 mg by mouth once.  Modified Medications   No medications on file  Discontinued Medications   IBUPROFEN (ADVIL,MOTRIN) 600 MG TABLET    Take 1 tablet (600 mg total) by mouth every 6 (six) hours.   LABETALOL (NORMODYNE) 100 MG TABLET    Take 100 mg by mouth 2 (two) times daily.   METRONIDAZOLE (FLAGYL) 500 MG TABLET    Take 1 tablet (500 mg  total) by mouth 2 (two) times daily.    Physical Exam:  Vitals:   02/24/20 1314  BP: (!) 146/98  Pulse: 97  Temp: (!) 96.9 F (36.1 C)  TempSrc: Temporal  SpO2: 98%  Weight: 182 lb (82.6 kg)  Height: 5' 6.03" (1.677 m)   Body mass index is 29.35 kg/m. Wt Readings from Last 3 Encounters:  02/24/20 182 lb (82.6 kg)  06/17/18 188 lb 12.8 oz (85.6 kg)  03/15/18 179 lb 8 oz (81.4 kg)    Physical Exam Constitutional:      General: She is not in acute  distress.    Appearance: She is well-developed. She is not diaphoretic.  HENT:     Head: Normocephalic and atraumatic.     Mouth/Throat:     Pharynx: No oropharyngeal exudate.  Eyes:     Conjunctiva/sclera: Conjunctivae normal.     Pupils: Pupils are equal, round, and reactive to light.  Cardiovascular:     Rate and Rhythm: Normal rate and regular rhythm.     Heart sounds: Normal heart sounds.  Pulmonary:     Effort: Pulmonary effort is normal.     Breath sounds: Normal breath sounds.  Abdominal:     General: Bowel sounds are normal.     Palpations: Abdomen is soft.  Musculoskeletal:        General: No tenderness.     Cervical back: Normal range of motion and neck supple.  Skin:    General: Skin is warm and dry.  Neurological:     Mental Status: She is alert and oriented to person, place, and time.     Labs reviewed: Basic Metabolic Panel: No results for input(s): NA, K, CL, CO2, GLUCOSE, BUN, CREATININE, CALCIUM, MG, PHOS, TSH in the last 8760 hours. Liver Function Tests: No results for input(s): AST, ALT, ALKPHOS, BILITOT, PROT, ALBUMIN in the last 8760 hours. No results for input(s): LIPASE, AMYLASE in the last 8760 hours. No results for input(s): AMMONIA in the last 8760 hours. CBC: No results for input(s): WBC, NEUTROABS, HGB, HCT, MCV, PLT in the last 8760 hours. Lipid Panel: No results for input(s): CHOL, HDL, LDLCALC, TRIG, CHOLHDL, LDLDIRECT in the last 8760 hours. TSH: No results for input(s): TSH in the last 8760 hours. A1C: No results found for: HGBA1C   Assessment/Plan 1. Essential hypertension -reports she takes blood pressure at work and it varies, will have her record a blood pressure log (after medications and been sitting at least 5 mins) and send via mychart -low sodium diet.  - hydrochlorothiazide (HYDRODIURIL) 25 MG tablet; Take 1 tablet (25 mg total) by mouth daily.  Dispense: 30 tablet; Refill: 0 - COMPLETE METABOLIC PANEL WITH GFR;  Future  2. Hyperlipidemia, unspecified hyperlipidemia type -dietary modifications. Will follow up - Lipid Panel; Future - COMPLETE METABOLIC PANEL WITH GFR; Future  3. Overweight with body mass index (BMI) 25.0-29.9 Noted today, discussed healthy lifestyle with increase in physical activity and healthy eating  4. Body mass index 29.0-29.9, adult Noted.    Next appt: yearly, and has needed Will get fasting blood work.  Janene Harvey. [name]  [facility] [phone]

## 2020-02-24 NOTE — Patient Instructions (Addendum)
To make appt for fasting blood work in the next month  Follow up yearly   Please check blood pressure and record- make sure you have been sitting for at least 5 mins when you check your blood pressure, make sure you have taken medication as well.  Send blood pressure record through [employer]    DASH Eating Plan DASH stands for "Dietary Approaches to Stop Hypertension." The DASH eating plan is a healthy eating plan that has been shown to reduce high blood pressure (hypertension). It may also reduce your risk for type 2 diabetes, heart disease, and stroke. The DASH eating plan may also help with weight loss. What are tips for following this plan?  General guidelines  Avoid eating more than 2,300 mg (milligrams) of salt (sodium) a day. If you have hypertension, you may need to reduce your sodium intake to 1,500 mg a day.  Limit alcohol intake to no more than 1 drink a day for nonpregnant women and 2 drinks a day for men. One drink equals 12 oz of beer, 5 oz of wine, or 1 oz of hard liquor.  Work with your health care provider to maintain a healthy body weight or to lose weight. Ask what an ideal weight is for you.  Get at least 30 minutes of exercise that causes your heart to beat faster (aerobic exercise) most days of the week. Activities may include walking, swimming, or biking.  Work with your health care provider or diet and nutrition specialist (dietitian) to adjust your eating plan to your individual calorie needs. Reading food labels   Check food labels for the amount of sodium per serving. Choose foods with less than 5 percent of the Daily Value of sodium. Generally, foods with less than 300 mg of sodium per serving fit into this eating plan.  To find whole grains, look for the word "whole" as the first word in the ingredient list. Shopping  Buy products labeled as "low-sodium" or "no salt added."  Buy fresh foods. Avoid canned foods and premade or frozen meals. Cooking  Avoid  adding salt when cooking. Use salt-free seasonings or herbs instead of table salt or sea salt. Check with your health care provider or pharmacist before using salt substitutes.  Do not fry foods. Cook foods using healthy methods such as baking, boiling, grilling, and broiling instead.  Cook with heart-healthy oils, such as olive, canola, soybean, or sunflower oil. Meal planning  Eat a balanced diet that includes: ? 5 or more servings of fruits and vegetables each day. At each meal, try to fill half of your plate with fruits and vegetables. ? Up to 6-8 servings of whole grains each day. ? Less than 6 oz of lean meat, poultry, or fish each day. A 3-oz serving of meat is about the same size as a deck of cards. One egg equals 1 oz. ? 2 servings of low-fat dairy each day. ? A serving of nuts, seeds, or beans 5 times each week. ? Heart-healthy fats. Healthy fats called Omega-3 fatty acids are found in foods such as flaxseeds and coldwater fish, like sardines, salmon, and mackerel.  Limit how much you eat of the following: ? Canned or prepackaged foods. ? Food that is high in trans fat, such as fried foods. ? Food that is high in saturated fat, such as fatty meat. ? Sweets, desserts, sugary drinks, and other foods with added sugar. ? Full-fat dairy products.  Do not salt foods before eating.  Try to  eat at least 2 vegetarian meals each week.  Eat more home-cooked food and less restaurant, buffet, and fast food.  When eating at a restaurant, ask that your food be prepared with less salt or no salt, if possible. What foods are recommended? The items listed may not be a complete list. Talk with your dietitian about what dietary choices are best for you. Grains Whole-grain or whole-wheat bread. Whole-grain or whole-wheat pasta. Brown rice. Orpah Cobb. Bulgur. Whole-grain and low-sodium cereals. Pita bread. Low-fat, low-sodium crackers. Whole-wheat flour tortillas. Vegetables Fresh or  frozen vegetables (raw, steamed, roasted, or grilled). Low-sodium or reduced-sodium tomato and vegetable juice. Low-sodium or reduced-sodium tomato sauce and tomato paste. Low-sodium or reduced-sodium canned vegetables. Fruits All fresh, dried, or frozen fruit. Canned fruit in natural juice (without added sugar). Meat and other protein foods Skinless chicken or [place]. Ground chicken or [place]. Pork with fat trimmed off. Fish and seafood. Egg whites. Dried beans, peas, or lentils. Unsalted nuts, nut butters, and seeds. Unsalted canned beans. Lean cuts of beef with fat trimmed off. Low-sodium, lean deli meat. Dairy Low-fat (1%) or fat-free (skim) milk. Fat-free, low-fat, or reduced-fat cheeses. Nonfat, low-sodium ricotta or cottage cheese. Low-fat or nonfat yogurt. Low-fat, low-sodium cheese. Fats and oils Soft margarine without trans fats. Vegetable oil. Low-fat, reduced-fat, or light mayonnaise and salad dressings (reduced-sodium). Canola, safflower, olive, soybean, and sunflower oils. Avocado. Seasoning and other foods Herbs. Spices. Seasoning mixes without salt. Unsalted popcorn and pretzels. Fat-free sweets. What foods are not recommended? The items listed may not be a complete list. Talk with your dietitian about what dietary choices are best for you. Grains Baked goods made with fat, such as croissants, muffins, or some breads. Dry pasta or rice meal packs. Vegetables Creamed or fried vegetables. Vegetables in a cheese sauce. Regular canned vegetables (not low-sodium or reduced-sodium). Regular canned tomato sauce and paste (not low-sodium or reduced-sodium). Regular tomato and vegetable juice (not low-sodium or reduced-sodium). Rosita Fire. Olives. Fruits Canned fruit in a light or heavy syrup. Fried fruit. Fruit in cream or butter sauce. Meat and other protein foods Fatty cuts of meat. Ribs. Fried meat. Tomasa Blase. Sausage. Bologna and other processed lunch meats. Salami. Fatback. Hotdogs.  Bratwurst. Salted nuts and seeds. Canned beans with added salt. Canned or smoked fish. Whole eggs or egg yolks. Chicken or [place] with skin. Dairy Whole or 2% milk, cream, and half-and-half. Whole or full-fat cream cheese. Whole-fat or sweetened yogurt. Full-fat cheese. Nondairy creamers. Whipped toppings. Processed cheese and cheese spreads. Fats and oils Butter. Stick margarine. Lard. Shortening. Ghee. Bacon fat. Tropical oils, such as coconut, palm kernel, or palm oil. Seasoning and other foods Salted popcorn and pretzels. Onion salt, garlic salt, seasoned salt, table salt, and sea salt. Worcestershire sauce. Tartar sauce. Barbecue sauce. Teriyaki sauce. Soy sauce, including reduced-sodium. Steak sauce. Canned and packaged gravies. Fish sauce. Oyster sauce. Cocktail sauce. Horseradish that you find on the shelf. Ketchup. Mustard. Meat flavorings and tenderizers. Bouillon cubes. Hot sauce and Tabasco sauce. Premade or packaged marinades. Premade or packaged taco seasonings. Relishes. Regular salad dressings. Where to find more information:  National Heart, Lung, and Blood Institute: PopSteam.is  American Heart Association: www.heart.org Summary  The DASH eating plan is a healthy eating plan that has been shown to reduce high blood pressure (hypertension). It may also reduce your risk for type 2 diabetes, heart disease, and stroke.  With the DASH eating plan, you should limit salt (sodium) intake to 2,300 mg a day. If you have  hypertension, you may need to reduce your sodium intake to 1,500 mg a day.  When on the DASH eating plan, aim to eat more fresh fruits and vegetables, whole grains, lean proteins, low-fat dairy, and heart-healthy fats.  Work with your health care provider or diet and nutrition specialist (dietitian) to adjust your eating plan to your individual calorie needs. This information is not intended to replace advice given to you by your health care provider. Make sure you  discuss any questions you have with your health care provider. Document Revised: 07/24/2017 Document Reviewed: 08/04/2016 Elsevier Patient Education  2020 [employer].

## 2020-03-07 ENCOUNTER — Encounter: Payer: Self-pay | Admitting: Adult Health

## 2020-03-07 ENCOUNTER — Other Ambulatory Visit: Payer: Self-pay

## 2020-03-07 ENCOUNTER — Ambulatory Visit (INDEPENDENT_AMBULATORY_CARE_PROVIDER_SITE_OTHER): Payer: BC Managed Care – PPO | Admitting: Adult Health

## 2020-03-07 ENCOUNTER — Encounter: Payer: Self-pay | Admitting: Nurse Practitioner

## 2020-03-07 VITALS — BP 138/78 | HR 83 | Temp 97.8°F | Ht 66.3 in | Wt 182.0 lb

## 2020-03-07 DIAGNOSIS — I10 Essential (primary) hypertension: Secondary | ICD-10-CM

## 2020-03-07 DIAGNOSIS — Z20822 Contact with and (suspected) exposure to covid-19: Secondary | ICD-10-CM

## 2020-03-07 DIAGNOSIS — G4485 Primary stabbing headache: Secondary | ICD-10-CM

## 2020-03-07 NOTE — Progress Notes (Signed)
[facility]  Provider:   Kenard Gower - NP  Code Status:  Full Code  Goals of Care:  Advanced Directives 06/17/2018  Does Patient Have a Medical Advance Directive? No  Would patient like information on creating a medical advance directive? No - Patient declined     Chief Complaint  Patient presents with  . Acute Visit    Headache, dry cough, thirsty. No fever. Rapid COVID negative.     HPI: Patient is a 29 y.o. female seen today for an acute visit for headache. She stated that she woke up this morning with a stabbing headache, feeling sluggish, throat is dry and neck hurting. She denies having chills nor fever. She spent 30 mins with her sister 3 days ago. Her sister called her today that she tested positive for COVID-19. She said that her sister is asymptomatic and lives in [place], [place]. She has taken Ibuprofen 600 mg for her headache. She attributes her neck pain to sleeping in a "wrong position". She has not had her COVID-19 vaccines and stated that she is just waiting a while for other people to get vaccinated and see how they react to it. Discussed that COVID-19 vaccine is safe and will give her protection against the delta variant. Rapid PCR test done was negative for COVID-19.   Past Medical History:  Diagnosis Date  . Cholelithiasis 03/2017  . Chronic cholecystitis 03/2017  . Hypertension    states under control with med., has been on med. since age 74    Past Surgical History:  Procedure Laterality Date  . CHOLECYSTECTOMY    . CHOLECYSTECTOMY N/A 04/16/2017   Procedure: LAPAROSCOPIC CHOLECYSTECTOMY WITH INTRAOPERATIVE CHOLANGIOGRAM;  Surgeon: Darnell Level, MD;  Location: [facility];  Service: General;  Laterality: N/A;  . DERMOID CYST  EXCISION Left 12/19/2003   post-auricular  . ERCP N/A 04/17/2017   Procedure: ENDOSCOPIC RETROGRADE CHOLANGIOPANCREATOGRAPHY (ERCP);  Surgeon: Vida Rigger, MD;  Location[facility];  Service: Endoscopy;   Laterality: N/A;  . WISDOM TOOTH EXTRACTION  2008    Allergies  Allergen Reactions  . Sulfa Antibiotics Hives    Outpatient Encounter Medications as of 03/07/2020  Medication Sig  . etonogestrel (NEXPLANON) 68 MG IMPL implant 1 each by Subdermal route once. Left arm  . hydrochlorothiazide (HYDRODIURIL) 25 MG tablet Take 1 tablet (25 mg total) by mouth daily.   No facility-administered encounter medications on file as of 03/07/2020.    Review of Systems:  Review of Systems  Constitutional: Negative for activity change, appetite change, chills and fever.  HENT: Negative for congestion, mouth sores, sinus pressure, sneezing, tinnitus and trouble swallowing.   Eyes: Negative for pain, discharge, redness and itching.  Respiratory: Negative for shortness of breath and wheezing.   Cardiovascular: Negative for chest pain and leg swelling.  Gastrointestinal: Negative for abdominal pain, nausea and vomiting.  Endocrine: Negative.   Genitourinary: Negative for difficulty urinating.  Musculoskeletal: Positive for neck pain and neck stiffness. Negative for back pain and joint swelling.       Occasional, "I slept wrong"  Skin: Negative for color change and rash.  Psychiatric/Behavioral: Negative.     Health Maintenance  Topic Date Due  . Hepatitis C Screening  Never done  . COVID-19 Vaccine (1) Never done  . PAP SMEAR-Modifier  02/18/2019  . INFLUENZA VACCINE  03/25/2020  . TETANUS/TDAP  09/19/2026  . HIV Screening  Completed    Physical Exam: Vitals:   03/07/20 1507  BP: 138/78  Pulse: 83  Temp: 97.8 F (36.6 C)  TempSrc: Temporal  SpO2: 98%  Weight: 182 lb (82.6 kg)  Height: 5' 6.3" (1.684 m)   Body mass index is 29.11 kg/m. Physical Exam HENT:     Head: Normocephalic and atraumatic.     Nose: Nose normal.     Mouth/Throat:     Mouth: Mucous membranes are moist.  Cardiovascular:     Rate and Rhythm: Normal rate and regular rhythm.  Pulmonary:     Effort: Pulmonary  effort is normal.     Breath sounds: Normal breath sounds. No wheezing.  Abdominal:     Palpations: Abdomen is soft.  Musculoskeletal:        General: No swelling or tenderness. Normal range of motion.     Cervical back: Normal range of motion. No rigidity.  Skin:    General: Skin is warm and dry.  Neurological:     General: No focal deficit present.     Mental Status: She is alert.  Psychiatric:        Mood and Affect: Mood normal.        Behavior: Behavior normal.     Labs reviewed:  03/07/20   COVID-19 PCR Rapid test was negative   Assessment/Plan  1. Primary stabbing headache -  Continue Ibuprofen 200 mg take 2 tabs = 400 mg every 8 hours PRN    2. Close exposure to COVID-19 virus - COVID-19 PCR rapid test was negative -  Discussed to continue to monitor herself for fever, SOB, cough and to come back to office if she feels symptoms presented today gets worse -   Advised to get COVID-19 vaccine which will give her protection  against COVID-19 virus    3. Essential hypertension -   BP138/78, continue HCTZ   25 mg daily -    Avoid salty food and continue regular exercise    Labs/tests ordered:  COVID-19 PCR Rapid test   Next appt:  03/22/2020   [facility] - DNP, MSN, FNP-BC [phone] -  Monday to Friday, 8AM to 5PM [phone] -  After hours [facility]

## 2020-03-07 NOTE — Patient Instructions (Signed)
General Headache Without Cause A headache is pain or discomfort felt around the head or neck area. The specific cause of a headache may not be found. There are many causes and types of headaches. A few common ones are:  Tension headaches.  Migraine headaches.  Cluster headaches.  Chronic daily headaches. Follow these instructions at home: Watch your condition for any changes. Let your health care provider know about them. Take these steps to help with your condition: Managing pain      Take over-the-counter and prescription medicines only as told by your health care provider.  Lie down in a dark, quiet room when you have a headache.  If directed, put ice on your head and neck area: ? Put ice in a plastic bag. ? Place a towel between your skin and the bag. ? Leave the ice on for 20 minutes, 2-3 times per day.  If directed, apply heat to the affected area. Use the heat source that your health care provider recommends, such as a moist heat pack or a heating pad. ? Place a towel between your skin and the heat source. ? Leave the heat on for 20-30 minutes. ? Remove the heat if your skin turns bright red. This is especially important if you are unable to feel pain, heat, or cold. You may have a greater risk of getting burned.  Keep lights dim if bright lights bother you or make your headaches worse. Eating and drinking  Eat meals on a regular schedule.  If you drink alcohol: ? Limit how much you use to:  0-1 drink a day for women.  0-2 drinks a day for men. ? Be aware of how much alcohol is in your drink. In the [place] one drink equals one 12 oz bottle of beer (355 mL), one 5 oz glass of wine (148 mL), or one 1 oz glass of hard liquor (44 mL).  Stop drinking caffeine, or decrease the amount of caffeine you drink. General instructions   Keep a headache journal to help find out what may trigger your headaches. For example, write down: ? What you eat and drink. ? How much  sleep you get. ? Any change to your diet or medicines.  Try massage or other relaxation techniques.  Limit stress.  Sit up straight, and do not tense your muscles.  Do not use any products that contain nicotine or tobacco, such as cigarettes, e-cigarettes, and chewing tobacco. If you need help quitting, ask your health care provider.  Exercise regularly as told by your health care provider.  Sleep on a regular schedule. Get 7-9 hours of sleep each night, or the amount recommended by your health care provider.  Keep all follow-up visits as told by your health care provider. This is important. Contact a health care provider if:  Your symptoms are not helped by medicine.  You have a headache that is different from the usual headache.  You have nausea or you vomit.  You have a fever. Get help right away if:  Your headache becomes severe quickly.  Your headache gets worse after moderate to intense physical activity.  You have repeated vomiting.  You have a stiff neck.  You have a loss of vision.  You have problems with speech.  You have pain in the eye or ear.  You have muscular weakness or loss of muscle control.  You lose your balance or have trouble walking.  You feel faint or pass out.  You have confusion.    You have a seizure. Summary  A headache is pain or discomfort felt around the head or neck area.  There are many causes and types of headaches. In some cases, the cause may not be found.  Keep a headache journal to help find out what may trigger your headaches. Watch your condition for any changes. Let your health care provider know about them.  Contact a health care provider if you have a headache that is different from the usual headache, or if your symptoms are not helped by medicine.  Get help right away if your headache becomes severe, you vomit, you have a loss of vision, you lose your balance, or you have a seizure. This information is not  intended to replace advice given to you by your health care provider. Make sure you discuss any questions you have with your health care provider. Document Revised: 03/01/2018 Document Reviewed: 03/01/2018 Elsevier Patient Education  2020 Elsevier Inc.  

## 2020-03-07 NOTE — Telephone Encounter (Signed)
Message routed to Dinah Ngetich, NP. 

## 2020-03-07 NOTE — Telephone Encounter (Signed)
Schedule an appointment for Blood pressure evaluation today at the office .

## 2020-03-08 ENCOUNTER — Telehealth: Payer: Self-pay | Admitting: *Deleted

## 2020-03-08 NOTE — Telephone Encounter (Signed)
Yes she can get it done at the [employer] drive through.

## 2020-03-08 NOTE — Telephone Encounter (Signed)
No order required.

## 2020-03-08 NOTE — Telephone Encounter (Signed)
Patient was seen yesterday and had Covid Testing done which was NEGATIVE.  Patient stayed out of work today also and now her work is requiring her to have another COVID test done before she can come back to work.  ([facility] this done) Please Advise. Sent to [place] Bumpers out of office.

## 2020-03-08 NOTE — Telephone Encounter (Signed)
Will you place that order if one needs to be placed.

## 2020-03-09 NOTE — Telephone Encounter (Signed)
Patient notified and agreed.  

## 2020-03-19 ENCOUNTER — Other Ambulatory Visit: Payer: BC Managed Care – PPO

## 2020-03-19 ENCOUNTER — Other Ambulatory Visit: Payer: Self-pay

## 2020-03-19 DIAGNOSIS — I10 Essential (primary) hypertension: Secondary | ICD-10-CM

## 2020-03-19 DIAGNOSIS — E785 Hyperlipidemia, unspecified: Secondary | ICD-10-CM

## 2020-03-22 ENCOUNTER — Other Ambulatory Visit: Payer: BC Managed Care – PPO

## 2020-03-29 ENCOUNTER — Telehealth: Payer: Self-pay | Admitting: *Deleted

## 2020-03-29 NOTE — Telephone Encounter (Signed)
Patient called and stated that her labs have been moved to [facility] for tomorrow and she runs out of her blood pressure medication tomorrow and needs a refill. Stated that you weren't going to refill until she had bloodwork done but she doesn't want to run out of medication and she takes her last one tomorrow.  Please Advise.

## 2020-03-30 ENCOUNTER — Other Ambulatory Visit: Payer: Self-pay | Admitting: Family

## 2020-03-30 ENCOUNTER — Other Ambulatory Visit: Payer: BC Managed Care – PPO

## 2020-03-30 DIAGNOSIS — I10 Essential (primary) hypertension: Secondary | ICD-10-CM

## 2020-03-30 DIAGNOSIS — E785 Hyperlipidemia, unspecified: Secondary | ICD-10-CM | POA: Diagnosis not present

## 2020-03-30 MED ORDER — HYDROCHLOROTHIAZIDE 25 MG PO TABS: 25.0000 mg | ORAL_TABLET | Freq: Every day | ORAL | 0 refills | Status: DC

## 2020-03-30 NOTE — Telephone Encounter (Signed)
Hydrochlorothiazide send to pharmacy.

## 2020-03-31 LAB — COMPLETE METABOLIC PANEL WITH GFR
AG Ratio: 1.6 (calc) (ref 1.0–2.5)
ALT: 22 U/L (ref 6–29)
AST: 14 U/L (ref 10–30)
Albumin: 4.4 g/dL (ref 3.6–5.1)
Alkaline phosphatase (APISO): 65 U/L (ref 31–125)
BUN: 9 mg/dL (ref 7–25)
CO2: 26 mmol/L (ref 20–32)
Calcium: 9.2 mg/dL (ref 8.6–10.2)
Chloride: 105 mmol/L (ref 98–110)
Creat: 0.71 mg/dL (ref 0.50–1.10)
GFR, Est African American: 132 mL/min/{1.73_m2} (ref >=60)
GFR, Est Non African American: 114 mL/min/{1.73_m2} (ref >=60)
Globulin: 2.8 g/dL (calc) (ref 1.9–3.7)
Glucose, Bld: 81 mg/dL (ref 65–99)
Potassium: 4.2 mmol/L (ref 3.5–5.3)
Sodium: 140 mmol/L (ref 135–146)
Total Bilirubin: 0.6 mg/dL (ref 0.2–1.2)
Total Protein: 7.2 g/dL (ref 6.1–8.1)

## 2020-03-31 LAB — LIPID PANEL
Cholesterol: 191 mg/dL (ref <200)
HDL: 28 mg/dL — ABNORMAL LOW (ref >=50)
LDL Cholesterol (Calc): 133 mg/dL (calc) — ABNORMAL HIGH
Non-HDL Cholesterol (Calc): 163 mg/dL (calc) — ABNORMAL HIGH (ref <130)
Total CHOL/HDL Ratio: 6.8 (calc) — ABNORMAL HIGH (ref <5.0)
Triglycerides: 166 mg/dL — ABNORMAL HIGH (ref <150)

## 2020-04-30 ENCOUNTER — Other Ambulatory Visit: Payer: Self-pay | Admitting: Family

## 2020-04-30 DIAGNOSIS — I10 Essential (primary) hypertension: Secondary | ICD-10-CM

## 2020-05-01 NOTE — Telephone Encounter (Signed)
Pharmacy requested refill Pended Rx and sent to [facility] for approval due to [employer]. Shanda Bumps out of office)

## 2020-09-20 ENCOUNTER — Other Ambulatory Visit: Payer: Self-pay | Admitting: Family

## 2020-09-20 DIAGNOSIS — I10 Essential (primary) hypertension: Secondary | ICD-10-CM

## 2020-12-03 ENCOUNTER — Encounter: Payer: Self-pay | Admitting: Nurse Practitioner

## 2020-12-03 ENCOUNTER — Ambulatory Visit: Payer: BC Managed Care – PPO | Admitting: Nurse Practitioner

## 2020-12-03 ENCOUNTER — Other Ambulatory Visit: Payer: Self-pay

## 2020-12-03 VITALS — BP 140/90 | HR 76 | Temp 97.1°F | Ht 66.0 in | Wt 178.0 lb

## 2020-12-03 DIAGNOSIS — Z1159 Encounter for screening for other viral diseases: Secondary | ICD-10-CM

## 2020-12-03 DIAGNOSIS — H6121 Impacted cerumen, right ear: Secondary | ICD-10-CM

## 2020-12-03 DIAGNOSIS — E785 Hyperlipidemia, unspecified: Secondary | ICD-10-CM | POA: Diagnosis not present

## 2020-12-03 DIAGNOSIS — I10 Essential (primary) hypertension: Secondary | ICD-10-CM

## 2020-12-03 LAB — CBC WITH DIFFERENTIAL/PLATELET
Hemoglobin: 13.6 g/dL (ref 11.7–15.5)
Monocytes Relative: 6.2 %
RDW: 12.7 % (ref 11.0–15.0)
WBC: 6 10*3/uL (ref 3.8–10.8)

## 2020-12-03 MED ORDER — BLOOD PRESSURE CUFF MISC: 0 refills | Status: DC

## 2020-12-03 MED ORDER — LOSARTAN POTASSIUM 25 MG PO TABS: 25.0000 mg | ORAL_TABLET | Freq: Every day | ORAL | 1 refills | Status: DC

## 2020-12-03 MED ORDER — HYDROCHLOROTHIAZIDE 25 MG PO TABS: 1.0000 | ORAL_TABLET | Freq: Every day | ORAL | 3 refills | Status: DC

## 2020-12-03 NOTE — Progress Notes (Signed)
Careteam: Patient Care Team: Sharon Seller, NP as PCP - General (Geriatric Medicine) Darnell Level, MD as Consulting Physician (General Surgery) Philip Aspen, DO as Consulting Physician (Obstetrics and Gynecology)  PLACE OF SERVICE:  [facility]  Advanced Directive information Does Patient Have a Medical Advance Directive?: Yes, Does patient want to make changes to medical advance directive?: No - Patient declined  Allergies  Allergen Reactions  . Sulfa Antibiotics Hives    Chief Complaint  Patient presents with  . Follow-up    Follow-up on blood pressure and medication.Patient c/o ringing in ears      HPI: Patient is a 30 y.o. female for routine follow up  Goes to GYN yearly went March 11th for her yearly exam. UTD on PAP. Continues on nexplanon   htn- does not check blood pressure at home  Hyperlipidemia- has been working on diet.   Family hx of htn, hyperlipidemia.  Has occasionally ringing in ears, last short amount of time but goes away.   Review of Systems:  Review of Systems  Constitutional: Negative for chills, fever and weight loss.  HENT: Positive for tinnitus.   Eyes: Negative for blurred vision.  Respiratory: Negative for cough, sputum production and shortness of breath.   Cardiovascular: Negative for chest pain, palpitations and leg swelling.  Gastrointestinal: Negative for abdominal pain, constipation, diarrhea and heartburn.  Genitourinary: Negative for dysuria, frequency and urgency.  Musculoskeletal: Negative for back pain, falls, joint pain and myalgias.  Skin: Negative.   Neurological: Positive for headaches. Negative for dizziness.  Psychiatric/Behavioral: Negative for depression and memory loss. The patient does not have insomnia.     Past Medical History:  Diagnosis Date  . Cholelithiasis 03/2017  . Chronic cholecystitis 03/2017  . Hypertension    states under control with med., has been on med. since age 46   Past Surgical  History:  Procedure Laterality Date  . CHOLECYSTECTOMY    . CHOLECYSTECTOMY N/A 04/16/2017   Procedure: LAPAROSCOPIC CHOLECYSTECTOMY WITH INTRAOPERATIVE CHOLANGIOGRAM;  Surgeon: Darnell Level, MD;  Location: [facility];  Service: General;  Laterality: N/A;  . DERMOID CYST  EXCISION Left 12/19/2003   post-auricular  . ERCP N/A 04/17/2017   Procedure: ENDOSCOPIC RETROGRADE CHOLANGIOPANCREATOGRAPHY (ERCP);  Surgeon: Vida Rigger, MD;  Location: [facility];  Service: Endoscopy;  Laterality: N/A;  . WISDOM TOOTH EXTRACTION  2008   Social History:   reports that she has never smoked. She has never used smokeless tobacco. She reports current alcohol use. She reports that she does not use drugs.  Family History  Problem Relation Age of Onset  . Hypertension Mother   . Sleep apnea Mother   . High Cholesterol Mother   . Diabetes Father   . Stroke Maternal Aunt     Medications: Patient's Medications  New Prescriptions   No medications on file  Previous Medications   ETONOGESTREL (NEXPLANON) 68 MG IMPL IMPLANT    1 each by Subdermal route once. Left arm   HYDROCHLOROTHIAZIDE (HYDRODIURIL) 25 MG TABLET    Take 1 tablet by mouth once daily  Modified Medications   No medications on file  Discontinued Medications   No medications on file    Physical Exam:  Vitals:   12/03/20 0924  BP: 140/90  Pulse: 76  Temp: (!) 97.1 F (36.2 C)  TempSrc: Temporal  SpO2: 98%  Weight: 178 lb (80.7 kg)  Height: 5\' 6"  (1.676 m)   Body mass index is 28.73 kg/m. Wt Readings from  Last 3 Encounters:  12/03/20 178 lb (80.7 kg)  03/07/20 182 lb (82.6 kg)  02/24/20 182 lb (82.6 kg)    Physical Exam Constitutional:      General: She is not in acute distress.    Appearance: She is well-developed. She is not diaphoretic.  HENT:     Head: Normocephalic and atraumatic.     Right Ear: External ear normal. There is impacted cerumen.     Left Ear: Tympanic membrane, ear canal and external  ear normal.     Mouth/Throat:     Pharynx: No oropharyngeal exudate.  Eyes:     Conjunctiva/sclera: Conjunctivae normal.     Pupils: Pupils are equal, round, and reactive to light.  Cardiovascular:     Rate and Rhythm: Normal rate and regular rhythm.     Heart sounds: Normal heart sounds.  Pulmonary:     Effort: Pulmonary effort is normal.     Breath sounds: Normal breath sounds.  Abdominal:     General: Bowel sounds are normal.     Palpations: Abdomen is soft.  Musculoskeletal:        General: No tenderness.     Cervical back: Normal range of motion and neck supple.  Skin:    General: Skin is warm and dry.  Neurological:     Mental Status: She is alert and oriented to person, place, and time.     Labs reviewed: Basic Metabolic Panel: Recent Labs    03/30/20 0740  NA 140  K 4.2  CL 105  CO2 26  GLUCOSE 81  BUN 9  CREATININE 0.71  CALCIUM 9.2   Liver Function Tests: Recent Labs    03/30/20 0740  AST 14  ALT 22  BILITOT 0.6  PROT 7.2   No results for input(s): LIPASE, AMYLASE in the last 8760 hours. No results for input(s): AMMONIA in the last 8760 hours. CBC: No results for input(s): WBC, NEUTROABS, HGB, HCT, MCV, PLT in the last 8760 hours. Lipid Panel: Recent Labs    03/30/20 0740  CHOL 191  HDL 28*  LDLCALC 133*  TRIG 166*  CHOLHDL 6.8*   TSH: No results for input(s): TSH in the last 8760 hours. A1C: No results found for: HGBA1C   Assessment/Plan 1. Essential hypertension -elevated in office again on recheck Will start losartan 25 mg by mouth daily with hctz and to continue to work on dietary modification  - hydrochlorothiazide (HYDRODIURIL) 25 MG tablet; Take 1 tablet (25 mg total) by mouth daily.  Dispense: 90 tablet; Refill: 3 - COMPLETE METABOLIC PANEL WITH GFR - CBC with Differential/Platelet - losartan (COZAAR) 25 MG tablet; Take 1 tablet (25 mg total) by mouth daily.  Dispense: 90 tablet; Refill: 1 - Blood Pressure Monitoring (BLOOD  PRESSURE CUFF) MISC; To check blood pressure three times weekly  Dispense: 1 each; Refill: 0 -will have her monitor at home. Goal <140/90.    2. Hyperlipidemia, unspecified hyperlipidemia type -dietary modifications encouraged, will follow up labs today - Lipid Panel - COMPLETE METABOLIC PANEL WITH GFR  3. Need for hepatitis C screening test - Hepatitis C antibody  4. Impacted cerumen of right ear Removed via lavage and grabber, pt tolerated well.     Next appt: 6 months.  Janene Harvey. [name]  [facility] [phone]

## 2020-12-03 NOTE — Patient Instructions (Addendum)
Goal blood pressure <140/90  Start losartan 25 mg daily with hctz 25 mg daily    https://www.mata.com/.pdf">  DASH Eating Plan DASH stands for Dietary Approaches to Stop Hypertension. The DASH eating plan is a healthy eating plan that has been shown to:  Reduce high blood pressure (hypertension).  Reduce your risk for type 2 diabetes, heart disease, and stroke.  Help with weight loss. What are tips for following this plan? Reading food labels  Check food labels for the amount of salt (sodium) per serving. Choose foods with less than 5 percent of the Daily Value of sodium. Generally, foods with less than 300 milligrams (mg) of sodium per serving fit into this eating plan.  To find whole grains, look for the word "whole" as the first word in the ingredient list. Shopping  Buy products labeled as "low-sodium" or "no salt added."  Buy fresh foods. Avoid canned foods and pre-made or frozen meals. Cooking  Avoid adding salt when cooking. Use salt-free seasonings or herbs instead of table salt or sea salt. Check with your health care provider or pharmacist before using salt substitutes.  Do not fry foods. Cook foods using healthy methods such as baking, boiling, grilling, roasting, and broiling instead.  Cook with heart-healthy oils, such as olive, canola, avocado, soybean, or sunflower oil. Meal planning  Eat a balanced diet that includes: ? 4 or more servings of fruits and 4 or more servings of vegetables each day. Try to fill one-half of your plate with fruits and vegetables. ? 6-8 servings of whole grains each day. ? Less than 6 oz (170 g) of lean meat, poultry, or fish each day. A 3-oz (85-g) serving of meat is about the same size as a deck of cards. One egg equals 1 oz (28 g). ? 2-3 servings of low-fat dairy each day. One serving is 1 cup (237 mL). ? 1 serving of nuts, seeds, or beans 5 times each week. ? 2-3 servings of heart-healthy  fats. Healthy fats called omega-3 fatty acids are found in foods such as walnuts, flaxseeds, fortified milks, and eggs. These fats are also found in cold-water fish, such as sardines, salmon, and mackerel.  Limit how much you eat of: ? Canned or prepackaged foods. ? Food that is high in trans fat, such as some fried foods. ? Food that is high in saturated fat, such as fatty meat. ? Desserts and other sweets, sugary drinks, and other foods with added sugar. ? Full-fat dairy products.  Do not salt foods before eating.  Do not eat more than 4 egg yolks a week.  Try to eat at least 2 vegetarian meals a week.  Eat more home-cooked food and less restaurant, buffet, and fast food.   Lifestyle  When eating at a restaurant, ask that your food be prepared with less salt or no salt, if possible.  If you drink alcohol: ? Limit how much you use to:  0-1 drink a day for women who are not pregnant.  0-2 drinks a day for men. ? Be aware of how much alcohol is in your drink. In the [place] one drink equals one 12 oz bottle of beer (355 mL), one 5 oz glass of wine (148 mL), or one 1 oz glass of hard liquor (44 mL). General information  Avoid eating more than 2,300 mg of salt a day. If you have hypertension, you may need to reduce your sodium intake to 1,500 mg a day.  Work with your health care  provider to maintain a healthy body weight or to lose weight. Ask what an ideal weight is for you.  Get at least 30 minutes of exercise that causes your heart to beat faster (aerobic exercise) most days of the week. Activities may include walking, swimming, or biking.  Work with your health care provider or dietitian to adjust your eating plan to your individual calorie needs. What foods should I eat? Fruits All fresh, dried, or frozen fruit. Canned fruit in natural juice (without added sugar). Vegetables Fresh or frozen vegetables (raw, steamed, roasted, or grilled). Low-sodium or reduced-sodium tomato  and vegetable juice. Low-sodium or reduced-sodium tomato sauce and tomato paste. Low-sodium or reduced-sodium canned vegetables. Grains Whole-grain or whole-wheat bread. Whole-grain or whole-wheat pasta. Brown rice. Orpah Cobb. Bulgur. Whole-grain and low-sodium cereals. Pita bread. Low-fat, low-sodium crackers. Whole-wheat flour tortillas. Meats and other proteins Skinless chicken or [place]. Ground chicken or [place]. Pork with fat trimmed off. Fish and seafood. Egg whites. Dried beans, peas, or lentils. Unsalted nuts, nut butters, and seeds. Unsalted canned beans. Lean cuts of beef with fat trimmed off. Low-sodium, lean precooked or cured meat, such as sausages or meat loaves. Dairy Low-fat (1%) or fat-free (skim) milk. Reduced-fat, low-fat, or fat-free cheeses. Nonfat, low-sodium ricotta or cottage cheese. Low-fat or nonfat yogurt. Low-fat, low-sodium cheese. Fats and oils Soft margarine without trans fats. Vegetable oil. Reduced-fat, low-fat, or light mayonnaise and salad dressings (reduced-sodium). Canola, safflower, olive, avocado, soybean, and sunflower oils. Avocado. Seasonings and condiments Herbs. Spices. Seasoning mixes without salt. Other foods Unsalted popcorn and pretzels. Fat-free sweets. The items listed above may not be a complete list of foods and beverages you can eat. Contact a dietitian for more information. What foods should I avoid? Fruits Canned fruit in a light or heavy syrup. Fried fruit. Fruit in cream or butter sauce. Vegetables Creamed or fried vegetables. Vegetables in a cheese sauce. Regular canned vegetables (not low-sodium or reduced-sodium). Regular canned tomato sauce and paste (not low-sodium or reduced-sodium). Regular tomato and vegetable juice (not low-sodium or reduced-sodium). Rosita Fire. Olives. Grains Baked goods made with fat, such as croissants, muffins, or some breads. Dry pasta or rice meal packs. Meats and other proteins Fatty cuts of meat. Ribs.  Fried meat. Tomasa Blase. Bologna, salami, and other precooked or cured meats, such as sausages or meat loaves. Fat from the back of a pig (fatback). Bratwurst. Salted nuts and seeds. Canned beans with added salt. Canned or smoked fish. Whole eggs or egg yolks. Chicken or [place] with skin. Dairy Whole or 2% milk, cream, and half-and-half. Whole or full-fat cream cheese. Whole-fat or sweetened yogurt. Full-fat cheese. Nondairy creamers. Whipped toppings. Processed cheese and cheese spreads. Fats and oils Butter. Stick margarine. Lard. Shortening. Ghee. Bacon fat. Tropical oils, such as coconut, palm kernel, or palm oil. Seasonings and condiments Onion salt, garlic salt, seasoned salt, table salt, and sea salt. Worcestershire sauce. Tartar sauce. Barbecue sauce. Teriyaki sauce. Soy sauce, including reduced-sodium. Steak sauce. Canned and packaged gravies. Fish sauce. Oyster sauce. Cocktail sauce. Store-bought horseradish. Ketchup. Mustard. Meat flavorings and tenderizers. Bouillon cubes. Hot sauces. Pre-made or packaged marinades. Pre-made or packaged taco seasonings. Relishes. Regular salad dressings. Other foods Salted popcorn and pretzels. The items listed above may not be a complete list of foods and beverages you should avoid. Contact a dietitian for more information. Where to find more information  National Heart, Lung, and Blood Institute: PopSteam.is  American Heart Association: www.heart.org  Academy of Nutrition and Dietetics: www.eatright.org  National Kidney  Foundation: www.kidney.org Summary  The DASH eating plan is a healthy eating plan that has been shown to reduce high blood pressure (hypertension). It may also reduce your risk for type 2 diabetes, heart disease, and stroke.  When on the DASH eating plan, aim to eat more fresh fruits and vegetables, whole grains, lean proteins, low-fat dairy, and heart-healthy fats.  With the DASH eating plan, you should limit salt (sodium)  intake to 2,300 mg a day. If you have hypertension, you may need to reduce your sodium intake to 1,500 mg a day.  Work with your health care provider or dietitian to adjust your eating plan to your individual calorie needs. This information is not intended to replace advice given to you by your health care provider. Make sure you discuss any questions you have with your health care provider. Document Revised: 07/15/2019 Document Reviewed: 07/15/2019 Elsevier Patient Education  2021 [employer].

## 2020-12-04 LAB — COMPLETE METABOLIC PANEL WITH GFR
AG Ratio: 1.6 (calc) (ref 1.0–2.5)
ALT: 17 U/L (ref 6–29)
AST: 17 U/L (ref 10–30)
Albumin: 4.2 g/dL (ref 3.6–5.1)
Alkaline phosphatase (APISO): 52 U/L (ref 31–125)
BUN: 10 mg/dL (ref 7–25)
CO2: 27 mmol/L (ref 20–32)
Calcium: 9.3 mg/dL (ref 8.6–10.2)
Chloride: 103 mmol/L (ref 98–110)
Creat: 0.65 mg/dL (ref 0.50–1.10)
GFR, Est African American: 137 mL/min/{1.73_m2} (ref >=60)
GFR, Est Non African American: 118 mL/min/{1.73_m2} (ref >=60)
Globulin: 2.7 g/dL (calc) (ref 1.9–3.7)
Glucose, Bld: 77 mg/dL (ref 65–99)
Potassium: 4 mmol/L (ref 3.5–5.3)
Sodium: 138 mmol/L (ref 135–146)
Total Bilirubin: 0.4 mg/dL (ref 0.2–1.2)
Total Protein: 6.9 g/dL (ref 6.1–8.1)

## 2020-12-04 LAB — LIPID PANEL
Cholesterol: 185 mg/dL (ref <200)
HDL: 43 mg/dL — ABNORMAL LOW (ref >=50)
LDL Cholesterol (Calc): 121 mg/dL (calc) — ABNORMAL HIGH
Non-HDL Cholesterol (Calc): 142 mg/dL (calc) — ABNORMAL HIGH (ref <130)
Total CHOL/HDL Ratio: 4.3 (calc) (ref <5.0)
Triglycerides: 105 mg/dL (ref <150)

## 2020-12-04 LAB — CBC WITH DIFFERENTIAL/PLATELET
Absolute Monocytes: 372 cells/uL (ref 200–950)
Basophils Absolute: 30 cells/uL (ref 0–200)
Basophils Relative: 0.5 %
Eosinophils Absolute: 78 cells/uL (ref 15–500)
Eosinophils Relative: 1.3 %
HCT: 40.7 % (ref 35.0–45.0)
Lymphs Abs: 2196 cells/uL (ref 850–3900)
MCH: 31.1 pg (ref 27.0–33.0)
MCHC: 33.4 g/dL (ref 32.0–36.0)
MCV: 93.1 fL (ref 80.0–100.0)
MPV: 10.3 fL (ref 7.5–12.5)
Neutro Abs: 3324 cells/uL (ref 1500–7800)
Neutrophils Relative %: 55.4 %
Platelets: 261 10*3/uL (ref 140–400)
RBC: 4.37 10*6/uL (ref 3.80–5.10)
Total Lymphocyte: 36.6 %

## 2020-12-04 LAB — HEPATITIS C ANTIBODY
Hepatitis C Ab: NONREACTIVE
SIGNAL TO CUT-OFF: 0 (ref <1.00)

## 2021-06-03 ENCOUNTER — Other Ambulatory Visit: Payer: Self-pay

## 2021-06-03 ENCOUNTER — Encounter: Payer: Self-pay | Admitting: Nurse Practitioner

## 2021-06-03 ENCOUNTER — Ambulatory Visit: Payer: 59 | Admitting: Nurse Practitioner

## 2021-06-03 VITALS — BP 140/90 | HR 77 | Temp 97.8°F | Ht 66.0 in | Wt 178.0 lb

## 2021-06-03 DIAGNOSIS — Z23 Encounter for immunization: Secondary | ICD-10-CM | POA: Diagnosis not present

## 2021-06-03 DIAGNOSIS — E785 Hyperlipidemia, unspecified: Secondary | ICD-10-CM | POA: Diagnosis not present

## 2021-06-03 DIAGNOSIS — E663 Overweight: Secondary | ICD-10-CM | POA: Diagnosis not present

## 2021-06-03 DIAGNOSIS — I10 Essential (primary) hypertension: Secondary | ICD-10-CM

## 2021-06-03 MED ORDER — LOSARTAN POTASSIUM 50 MG PO TABS: 50.0000 mg | ORAL_TABLET | Freq: Every day | ORAL | 1 refills | Status: DC

## 2021-06-03 NOTE — Progress Notes (Signed)
Careteam: Patient Care Team: Kristi Seller, NP as PCP - General (Geriatric Medicine) Kristi Level, MD as Consulting Physician (General Surgery) Kristi Aspen, DO as Consulting Physician (Obstetrics and Gynecology)  PLACE OF SERVICE:  [facility]  Advanced Directive information Does Patient Have a Medical Advance Directive?: No, Would patient like information on creating a medical advance directive?: No - Patient declined  Allergies  Allergen Reactions   Sulfa Antibiotics Hives    Chief Complaint  Patient presents with   Medical Management of Chronic Issues    6 month follow-up. Discuss need for covid #3 and flu vaccine or exclude.      HPI: Patient is a 30 y.o. female for follow up on blood pressure.   145/95 130/75  Review of Systems:  Review of Systems  Constitutional:  Negative for chills, fever and weight loss.  HENT:  Negative for tinnitus.   Respiratory:  Negative for cough, sputum production and shortness of breath.   Cardiovascular:  Negative for chest pain, palpitations and leg swelling.  Gastrointestinal:  Negative for abdominal pain, constipation, diarrhea and heartburn.  Genitourinary:  Negative for dysuria, frequency and urgency.  Musculoskeletal:  Negative for back pain, falls, joint pain and myalgias.  Skin: Negative.   Neurological:  Negative for dizziness and headaches.  Psychiatric/Behavioral:  Negative for depression and memory loss. The patient does not have insomnia.    Past Medical History:  Diagnosis Date   Cholelithiasis 03/2017   Chronic cholecystitis 03/2017   Hypertension    states under control with med., has been on med. since age 62   Past Surgical History:  Procedure Laterality Date   CHOLECYSTECTOMY     CHOLECYSTECTOMY N/A 04/16/2017   Procedure: LAPAROSCOPIC CHOLECYSTECTOMY WITH INTRAOPERATIVE CHOLANGIOGRAM;  Surgeon: Kristi Level, MD;  Location: [facility];  Service: General;  Laterality: N/A;   DERMOID  CYST  EXCISION Left 12/19/2003   post-auricular   ERCP N/A 04/17/2017   Procedure: ENDOSCOPIC RETROGRADE CHOLANGIOPANCREATOGRAPHY (ERCP);  Surgeon: Kristi Rigger, MD;  Location: [facility];  Service: Endoscopy;  Laterality: N/A;   WISDOM TOOTH EXTRACTION  2008   Social History:   reports that she has never smoked. She has never used smokeless tobacco. She reports current alcohol use. She reports that she does not use drugs.  Family History  Problem Relation Age of Onset   Hypertension Mother    Sleep apnea Mother    High Cholesterol Mother    Diabetes Father    Pancreatic cancer Maternal Grandmother    Stroke Maternal Aunt     Medications: Patient's Medications  New Prescriptions   No medications on file  Previous Medications   BLOOD PRESSURE MONITORING (BLOOD PRESSURE CUFF) MISC    To check blood pressure three times weekly   ETONOGESTREL (NEXPLANON) 68 MG IMPL IMPLANT    1 each by Subdermal route once. Left arm   HYDROCHLOROTHIAZIDE (HYDRODIURIL) 25 MG TABLET    Take 1 tablet (25 mg total) by mouth daily.   LOSARTAN (COZAAR) 25 MG TABLET    Take 1 tablet (25 mg total) by mouth daily.  Modified Medications   No medications on file  Discontinued Medications   No medications on file    Physical Exam:  Vitals:   06/03/21 0858  BP: (!) 156/92  Pulse: 77  Temp: 97.8 F (36.6 C)  TempSrc: Temporal  SpO2: 99%  Weight: 178 lb (80.7 kg)  Height: 5\' 6"  (1.676 m)   Body mass index is 28.73  kg/m. Wt Readings from Last 3 Encounters:  06/03/21 178 lb (80.7 kg)  12/03/20 178 lb (80.7 kg)  03/07/20 182 lb (82.6 kg)    Physical Exam Constitutional:      General: She is not in acute distress.    Appearance: She is well-developed. She is not diaphoretic.  HENT:     Head: Normocephalic and atraumatic.     Mouth/Throat:     Pharynx: No oropharyngeal exudate.  Eyes:     Conjunctiva/sclera: Conjunctivae normal.     Pupils: Pupils are equal, round, and reactive to light.   Cardiovascular:     Rate and Rhythm: Normal rate and regular rhythm.     Heart sounds: Normal heart sounds.  Pulmonary:     Effort: Pulmonary effort is normal.     Breath sounds: Normal breath sounds.  Abdominal:     General: Bowel sounds are normal.     Palpations: Abdomen is soft.  Musculoskeletal:     Cervical back: Normal range of motion and neck supple.     Right lower leg: No edema.     Left lower leg: No edema.  Skin:    General: Skin is warm and dry.  Neurological:     Mental Status: She is alert.  Psychiatric:        Mood and Affect: Mood normal.    Labs reviewed: Basic Metabolic Panel: Recent Labs    12/03/20 1017  NA 138  K 4.0  CL 103  CO2 27  GLUCOSE 77  BUN 10  CREATININE 0.65  CALCIUM 9.3   Liver Function Tests: Recent Labs    12/03/20 1017  AST 17  ALT 17  BILITOT 0.4  PROT 6.9   No results for input(s): LIPASE, AMYLASE in the last 8760 hours. No results for input(s): AMMONIA in the last 8760 hours. CBC: Recent Labs    12/03/20 1017  WBC 6.0  NEUTROABS 3,324  HGB 13.6  HCT 40.7  MCV 93.1  PLT 261   Lipid Panel: Recent Labs    12/03/20 1017  CHOL 185  HDL 43*  LDLCALC 121*  TRIG 105  CHOLHDL 4.3   TSH: No results for input(s): TSH in the last 8760 hours. A1C: No results found for: HGBA1C   Assessment/Plan 1. Need for influenza vaccination - Flu Vaccine QUAD 6+ mos PF IM (Fluarix Quad PF)  2. Essential hypertension -encouraged dietary modifications -will increase losartan to get to goal with HCTZ  - losartan (COZAAR) 50 MG tablet; Take 1 tablet (50 mg total) by mouth daily.  Dispense: 90 tablet; Refill: 1  3. Hyperlipidemia, unspecified hyperlipidemia type -dietary modifications encouraged. Will check fasting labs with follow up appt.   4. Overweight with body mass index (BMI) 25.0-29.9 -education provided on healthy weight loss through increase in physical activity and proper nutrition. She does walk in the  evenings. Recommended 30 mins 5 days a week.     Next appt: 6 months, wit fasting labs. Kristi Richmond. Kristi Richmond  [facility] [phone]

## 2021-06-03 NOTE — Patient Instructions (Signed)
Increase losartan to 50 mg daily Continue HCTZ 25 mg daily  Goal blood pressure I <140/90. Resting, after you have taken medication for the day.

## 2021-06-05 ENCOUNTER — Ambulatory Visit: Payer: Self-pay | Admitting: Nurse Practitioner

## 2021-09-04 ENCOUNTER — Encounter: Payer: Self-pay | Admitting: Family

## 2021-09-04 ENCOUNTER — Other Ambulatory Visit: Payer: Self-pay

## 2021-09-04 ENCOUNTER — Ambulatory Visit: Payer: 59 | Admitting: Family

## 2021-09-04 VITALS — BP 140/90 | HR 103 | Temp 97.9°F | Resp 16 | Ht 66.0 in | Wt 179.4 lb

## 2021-09-04 DIAGNOSIS — R0981 Nasal congestion: Secondary | ICD-10-CM

## 2021-09-04 DIAGNOSIS — H6123 Impacted cerumen, bilateral: Secondary | ICD-10-CM | POA: Diagnosis not present

## 2021-09-04 DIAGNOSIS — H1032 Unspecified acute conjunctivitis, left eye: Secondary | ICD-10-CM

## 2021-09-04 DIAGNOSIS — I10 Essential (primary) hypertension: Secondary | ICD-10-CM | POA: Diagnosis not present

## 2021-09-04 MED ORDER — LOSARTAN POTASSIUM 50 MG PO TABS: 75.0000 mg | ORAL_TABLET | Freq: Every day | ORAL | 1 refills | Status: DC

## 2021-09-04 MED ORDER — FLUTICASONE PROPIONATE 50 MCG/ACT NA SUSP: 2.0000 | Freq: Every day | NASAL | 6 refills | Status: DC

## 2021-09-04 MED ORDER — ERYTHROMYCIN 5 MG/GM OP OINT: 1.0000 "application " | TOPICAL_OINTMENT | Freq: Three times a day (TID) | OPHTHALMIC | 0 refills | Status: AC

## 2021-09-04 NOTE — Progress Notes (Signed)
Provider: Stefano Trulson FNP-C  Lauree Chandler, NP  Patient Care Team: Lauree Chandler, NP as PCP - General (Geriatric Medicine) Armandina Gemma, MD as Consulting Physician (General Surgery) Allyn Kenner, DO as Consulting Physician (Obstetrics and Gynecology)  Extended Emergency Contact Information Primary Emergency Contact: [facility] Address: [address]          [place], [place] [address] Johnnette Litter of [place] Phone: [phone] Relation: Mother  Code Status:  Full Code  Goals of care: Advanced Directive information Advanced Directives 09/04/2021  Does Patient Have a Medical Advance Directive? No  Does patient want to make changes to medical advance directive? -  Would patient like information on creating a medical advance directive? No - Patient declined     Chief Complaint  Patient presents with   Acute Visit    Patient complains of eye being red and closing in the mornings for the past two days.    HPI:  Pt is a 31 y.o. female seen today for an acute visit for evaluation of left eye redness x 2 days.  Congested but no runny nose.    Past Medical History:  Diagnosis Date   Cholelithiasis 03/2017   Chronic cholecystitis 03/2017   Hypertension    states under control with med., has been on med. since age 40   Past Surgical History:  Procedure Laterality Date   CHOLECYSTECTOMY     CHOLECYSTECTOMY N/A 04/16/2017   Procedure: LAPAROSCOPIC CHOLECYSTECTOMY WITH INTRAOPERATIVE CHOLANGIOGRAM;  Surgeon: Armandina Gemma, MD;  Location: [place];  Service: General;  Laterality: N/A;   DERMOID CYST  EXCISION Left 12/19/2003   post-auricular   ERCP N/A 04/17/2017   Procedure: ENDOSCOPIC RETROGRADE CHOLANGIOPANCREATOGRAPHY (ERCP);  Surgeon: Clarene Essex, MD;  Location: [facility];  Service: Endoscopy;  Laterality: N/A;   WISDOM TOOTH EXTRACTION  2008    Allergies  Allergen Reactions   Sulfa Antibiotics Hives    Outpatient  Encounter Medications as of 09/04/2021  Medication Sig   etonogestrel (NEXPLANON) 68 MG IMPL implant 1 each by Subdermal route once. Left arm   hydrochlorothiazide (HYDRODIURIL) 25 MG tablet Take 1 tablet (25 mg total) by mouth daily.   losartan (COZAAR) 50 MG tablet Take 1 tablet (50 mg total) by mouth daily.   [DISCONTINUED] Blood Pressure Monitoring (BLOOD PRESSURE CUFF) MISC To check blood pressure three times weekly (Patient not taking: Reported on 06/03/2021)   No facility-administered encounter medications on file as of 09/04/2021.    Review of Systems  Constitutional:  Negative for appetite change, chills, fatigue and fever.  HENT:  Positive for congestion. Negative for rhinorrhea, sinus pressure, sinus pain, sneezing and sore throat.   Eyes:  Positive for discharge, redness and itching. Negative for visual disturbance.  Respiratory:  Negative for cough, chest tightness, shortness of breath and wheezing.   Cardiovascular:  Negative for chest pain, palpitations and leg swelling.  Neurological:  Negative for dizziness, light-headedness and headaches.   Immunization History  Administered Date(s) Administered   DTaP 09/03/1989, 11/02/1989, 01/13/1990, 06/06/1994   HPV Quadrivalent 05/04/2007, 03/15/2008, 07/21/2011   Hepatitis B 06/04/2001, 07/09/2001, 12/03/2001   HiB (PRP-OMP) 09/03/1989, 11/02/1989, 01/13/1990   Influenza Inj Mdck Quad With Preservative 05/26/2019   Influenza, Quadrivalent, Recombinant, Inj, Pf 05/23/2016   Influenza, Seasonal, Injecte, Preservative Fre 05/28/2015   Influenza,inj,Quad PF,6+ Mos 06/20/2018, 06/03/2021   Influenza-Unspecified 05/28/2015, 05/26/2017   MMR 10/04/1990, 06/06/1994   Meningococcal Conjugate 07/01/2004   OPV 09/03/1989, 11/02/1989, 01/13/1990, 01/04/1991   PFIZER(Purple Top)SARS-COV-2  Vaccination 04/27/2020, 05/18/2020   PPD Test 03/14/2014   Tdap 11/08/2003, 09/05/2009, 09/19/2016   Pertinent  Health Maintenance  Due  Topic Date Due   PAP SMEAR-Modifier  02/18/2019   INFLUENZA VACCINE  Completed   Fall Risk 02/24/2020 03/07/2020 12/03/2020 06/03/2021 09/04/2021  Falls in the past year? 0 0 0 0 0  Was there an injury with Fall? 0 - 0 0 0  Fall Risk Category Calculator 0 - 0 0 0  Fall Risk Category Low - Low Low Low  Patient Fall Risk Level Low fall risk - Low fall risk Low fall risk Low fall risk  Patient at Risk for Falls Due to - - - No Fall Risks No Fall Risks  Fall risk Follow up - - - Falls evaluation completed Falls evaluation completed   Functional Status Survey:    Vitals:   09/04/21 1537  BP: 140/90  Pulse: (!) 103  Resp: 16  Temp: 97.9 F (36.6 C)  SpO2: 99%  Weight: 179 lb 6.4 oz (81.4 kg)  Height: '5\' 6"'  (1.676 m)   Body mass index is 28.96 kg/m. Physical Exam Vitals reviewed.  Constitutional:      General: She is not in acute distress.    Appearance: She is overweight. She is not ill-appearing.  HENT:     Head: Normocephalic.     Right Ear: There is impacted cerumen.     Left Ear: There is no impacted cerumen.     Ears:     Comments: Bilateral ear cerumen lavaged with warm water and hydrogen peroxide moderate amounts of cerumen obtained.right ear cerumen removed with using a curette Tolerated procedure well.TM clear without any signs of infection.     Nose: Congestion present. No rhinorrhea.     Right Turbinates: Not swollen or pale.     Left Turbinates: Swollen. Not pale.     Right Sinus: No maxillary sinus tenderness or frontal sinus tenderness.     Left Sinus: No maxillary sinus tenderness or frontal sinus tenderness.     Mouth/Throat:     Mouth: Mucous membranes are moist.     Pharynx: Oropharynx is clear. No oropharyngeal exudate or posterior oropharyngeal erythema.  Eyes:     General: No scleral icterus.       Right eye: No discharge.        Left eye: No discharge.     Extraocular Movements: Extraocular movements intact.     Conjunctiva/sclera: Conjunctivae  normal.     Pupils: Pupils are equal, round, and reactive to light.  Cardiovascular:     Rate and Rhythm: Normal rate and regular rhythm.     Pulses: Normal pulses.     Heart sounds: Normal heart sounds. No murmur heard.   No friction rub. No gallop.  Pulmonary:     Effort: Pulmonary effort is normal. No respiratory distress.     Breath sounds: Normal breath sounds. No wheezing, rhonchi or rales.  Chest:     Chest wall: No tenderness.  Musculoskeletal:     Cervical back: Normal range of motion. No rigidity or tenderness.  Lymphadenopathy:     Cervical: No cervical adenopathy.  Neurological:     Mental Status: She is alert and oriented to person, place, and time.     Motor: No weakness.     Gait: Gait normal.  Psychiatric:        Mood and Affect: Mood normal.        Behavior: Behavior normal.  Labs reviewed: Recent Labs    12/03/20 1017  NA 138  K 4.0  CL 103  CO2 27  GLUCOSE 77  BUN 10  CREATININE 0.65  CALCIUM 9.3   Recent Labs    12/03/20 1017  AST 17  ALT 17  BILITOT 0.4  PROT 6.9   Recent Labs    12/03/20 1017  WBC 6.0  NEUTROABS 3,324  HGB 13.6  HCT 40.7  MCV 93.1  PLT 261   No results found for: TSH No results found for: HGBA1C Lab Results  Component Value Date   CHOL 185 12/03/2020   HDL 43 (L) 12/03/2020   LDLCALC 121 (H) 12/03/2020   TRIG 105 12/03/2020   CHOLHDL 4.3 12/03/2020    Significant Diagnostic Results in last 30 days:  No results found.  Assessment/Plan  1. Essential hypertension B/p elevated Home systolic readings also high in the 140's   Will increase losartan from 50 mg tablet to 75 mg - losartan (COZAAR) 50 MG tablet; Take 1.5 tablets (75 mg total) by mouth daily.  Dispense: 90 tablet; Refill: 1  2. Acute bacterial conjunctivitis of left eye Reports left eye itching,pain with yellow drainage in the morning unable to open eye in the mornings. - advised to cleanse eye with warm wet wash cloth then apply  erythromycin  - erythromycin ophthalmic ointment; Place 1 application into the left eye 3 (three) times daily for 7 days.  Dispense: 3.5 g; Refill: 0 - Notify provider if symptoms worsen or fail to improve    3. Chronic nasal congestion Left nare posterior swollen turbinate - Flonase 2 spray into nostril once daily   4. Bilateral impacted cerumen Bilateral ear cerumen lavaged with warm water and hydrogen peroxide moderate amounts of cerumen obtained.right ear cerumen removed with using a curette Tolerated procedure well.TM clear without any signs of infection.  Family/ staff Communication: Reviewed plan of care with patient verbalized understanding.   Labs/tests ordered: None   Next Appointment: As needed if symptoms worsen or fail to improve    Sandrea Hughs, NP

## 2021-11-14 DIAGNOSIS — Z809 Family history of malignant neoplasm, unspecified: Secondary | ICD-10-CM | POA: Insufficient documentation

## 2021-12-06 ENCOUNTER — Encounter: Payer: Self-pay | Admitting: Nurse Practitioner

## 2021-12-06 ENCOUNTER — Ambulatory Visit: Payer: 59 | Admitting: Nurse Practitioner

## 2021-12-06 VITALS — BP 128/76 | HR 81 | Temp 97.5°F | Ht 66.0 in | Wt 182.0 lb

## 2021-12-06 DIAGNOSIS — E663 Overweight: Secondary | ICD-10-CM

## 2021-12-06 DIAGNOSIS — E785 Hyperlipidemia, unspecified: Secondary | ICD-10-CM

## 2021-12-06 DIAGNOSIS — R0683 Snoring: Secondary | ICD-10-CM | POA: Diagnosis not present

## 2021-12-06 DIAGNOSIS — I10 Essential (primary) hypertension: Secondary | ICD-10-CM

## 2021-12-06 DIAGNOSIS — R0981 Nasal congestion: Secondary | ICD-10-CM

## 2021-12-06 LAB — COMPLETE METABOLIC PANEL WITH GFR
AG Ratio: 1.6 (calc) (ref 1.0–2.5)
ALT: 16 U/L (ref 6–29)
AST: 16 U/L (ref 10–30)
Albumin: 4.2 g/dL (ref 3.6–5.1)
Alkaline phosphatase (APISO): 49 U/L (ref 31–125)
BUN: 7 mg/dL (ref 7–25)
CO2: 28 mmol/L (ref 20–32)
Calcium: 9 mg/dL (ref 8.6–10.2)
Chloride: 104 mmol/L (ref 98–110)
Creat: 0.65 mg/dL (ref 0.50–0.97)
Globulin: 2.7 g/dL (calc) (ref 1.9–3.7)
Glucose, Bld: 81 mg/dL (ref 65–99)
Potassium: 3.8 mmol/L (ref 3.5–5.3)
Sodium: 138 mmol/L (ref 135–146)
Total Bilirubin: 0.4 mg/dL (ref 0.2–1.2)
Total Protein: 6.9 g/dL (ref 6.1–8.1)
eGFR: 120 mL/min/{1.73_m2} (ref >=60)

## 2021-12-06 LAB — CBC WITH DIFFERENTIAL/PLATELET
Absolute Monocytes: 389 cells/uL (ref 200–950)
Basophils Absolute: 18 cells/uL (ref 0–200)
Basophils Relative: 0.3 %
Eosinophils Absolute: 77 cells/uL (ref 15–500)
Eosinophils Relative: 1.3 %
HCT: 38.7 % (ref 35.0–45.0)
Hemoglobin: 13.2 g/dL (ref 11.7–15.5)
Lymphs Abs: 2077 cells/uL (ref 850–3900)
MCH: 31.2 pg (ref 27.0–33.0)
MCHC: 34.1 g/dL (ref 32.0–36.0)
MCV: 91.5 fL (ref 80.0–100.0)
MPV: 10.2 fL (ref 7.5–12.5)
Monocytes Relative: 6.6 %
Neutro Abs: 3339 cells/uL (ref 1500–7800)
Neutrophils Relative %: 56.6 %
Platelets: 258 10*3/uL (ref 140–400)
RBC: 4.23 10*6/uL (ref 3.80–5.10)
RDW: 12.3 % (ref 11.0–15.0)
Total Lymphocyte: 35.2 %
WBC: 5.9 10*3/uL (ref 3.8–10.8)

## 2021-12-06 LAB — LIPID PANEL
Cholesterol: 195 mg/dL (ref <200)
HDL: 36 mg/dL — ABNORMAL LOW (ref >=50)
LDL Cholesterol (Calc): 136 mg/dL (calc) — ABNORMAL HIGH
Non-HDL Cholesterol (Calc): 159 mg/dL (calc) — ABNORMAL HIGH (ref <130)
Total CHOL/HDL Ratio: 5.4 (calc) — ABNORMAL HIGH (ref <5.0)
Triglycerides: 123 mg/dL (ref <150)

## 2021-12-06 MED ORDER — LOSARTAN POTASSIUM 50 MG PO TABS: 75.0000 mg | ORAL_TABLET | Freq: Every day | ORAL | 2 refills | Status: DC

## 2021-12-06 NOTE — Progress Notes (Signed)
? ? ?Careteam: ?Patient Care Team: ?Lauree Chandler, NP as PCP - General (Geriatric Medicine) ?Armandina Gemma, MD as Consulting Physician (General Surgery) ?Allyn Kenner, DO as Consulting Physician (Obstetrics and Gynecology) ? ?PLACE OF SERVICE:  ?[facility]  ?Advanced Directive information ?Does Patient Have a Medical Advance Directive?: No, Would patient like information on creating a medical advance directive?: No - Patient declined ? ?Allergies  ?Allergen Reactions  ? Sulfa Antibiotics Hives  ? ? ?Chief Complaint  ?Patient presents with  ? Medical Management of Chronic Issues  ?  6 month follow-up. Discuss need for pap and additional covid boosters or post pone if patient refuses. NCIR verified. Stop's breathing when sleeping on back, family history of of sleep apnea. Patient has never had a sleep study.   ? ? ? ?HPI: Patient is a 31 y.o. female for routine follow up.  ? ?She tries to walk on lunch and in the afternoon.  ? ?Her boyfriend says she snores loudly and stops breathing at times. Her mother and brother both have OSA nad are on cpap.  ? ?She is UTD on PAP, 2 girls ages 35 and 64. On nexplanon  ? ?Fasting today.  ? ?Has not had COVID booster- not planning on getting.  ? ? ?Review of Systems:  ?Review of Systems  ?Constitutional:  Negative for chills, fever and weight loss.  ?HENT:  Negative for tinnitus.   ?Respiratory:  Negative for cough, sputum production and shortness of breath.   ?Cardiovascular:  Negative for chest pain, palpitations and leg swelling.  ?Gastrointestinal:  Negative for abdominal pain, constipation, diarrhea and heartburn.  ?Genitourinary:  Negative for dysuria, frequency and urgency.  ?Musculoskeletal:  Negative for back pain, falls, joint pain and myalgias.  ?Skin: Negative.   ?Neurological:  Negative for dizziness and headaches.  ?Psychiatric/Behavioral:  Negative for depression and memory loss. The patient does not have insomnia.   ? ?Past Medical History:  ?Diagnosis Date   ? Cholelithiasis 03/2017  ? Chronic cholecystitis 03/2017  ? Hypertension   ? states under control with med., has been on med. since age 12  ? ?Past Surgical History:  ?Procedure Laterality Date  ? CHOLECYSTECTOMY    ? CHOLECYSTECTOMY N/A 04/16/2017  ? Procedure: LAPAROSCOPIC CHOLECYSTECTOMY WITH INTRAOPERATIVE CHOLANGIOGRAM;  Surgeon: Armandina Gemma, MD;  Location: [facility];  Service: General;  Laterality: N/A;  ? DERMOID CYST  EXCISION Left 12/19/2003  ? post-auricular  ? ERCP N/A 04/17/2017  ? Procedure: ENDOSCOPIC RETROGRADE CHOLANGIOPANCREATOGRAPHY (ERCP);  Surgeon: Clarene Essex, MD;  Location: [facility];  Service: Endoscopy;  Laterality: N/A;  ? [place] EXTRACTION  2008  ? ?Social History: ?  reports that she has never smoked. She has never used smokeless tobacco. She reports current alcohol use. She reports that she does not use drugs. ? ?Family History  ?Problem Relation Age of Onset  ? Hypertension Mother   ? Sleep apnea Mother   ? High Cholesterol Mother   ? Diabetes Father   ? Pancreatic cancer Maternal Grandmother   ? Stroke Maternal Aunt   ? ? ?Medications: ?Patient's Medications  ?New Prescriptions  ? No medications on file  ?Previous Medications  ? ETONOGESTREL (NEXPLANON) 68 MG IMPL IMPLANT    1 each by Subdermal route once. Left arm  ? FLUTICASONE (FLONASE) 50 MCG/ACT NASAL SPRAY    Place 2 sprays into both nostrils daily.  ? HYDROCHLOROTHIAZIDE (HYDRODIURIL) 25 MG TABLET    Take 1 tablet (25 mg total) by mouth daily.  ?  LOSARTAN (COZAAR) 50 MG TABLET    Take 1.5 tablets (75 mg total) by mouth daily.  ?Modified Medications  ? No medications on file  ?Discontinued Medications  ? No medications on file  ? ? ?Physical Exam: ? ?Vitals:  ? 12/06/21 0809  ?BP: 128/76  ?Pulse: 81  ?Temp: (!) 97.5 ?F (36.4 ?C)  ?TempSrc: Temporal  ?SpO2: 97%  ?Weight: 182 lb (82.6 kg)  ?Height: _0  (1.676 m)  ? ?Body mass index is 29.38 kg/m?. ?Wt Readings from Last 3 Encounters:  ?12/06/21 182 lb  (82.6 kg)  ?09/04/21 179 lb 6.4 oz (81.4 kg)  ?06/03/21 178 lb (80.7 kg)  ? ? ?Physical Exam ?Constitutional:   ?   General: She is not in acute distress. ?   Appearance: She is well-developed. She is not diaphoretic.  ?HENT:  ?   Head: Normocephalic and atraumatic.  ?   Right Ear: Tympanic membrane, ear canal and external ear normal.  ?   Left Ear: Tympanic membrane, ear canal and external ear normal.  ?   Mouth/Throat:  ?   Pharynx: No oropharyngeal exudate.  ?Eyes:  ?   Conjunctiva/sclera: Conjunctivae normal.  ?   Pupils: Pupils are equal, round, and reactive to light.  ?Cardiovascular:  ?   Rate and Rhythm: Normal rate and regular rhythm.  ?   Heart sounds: Normal heart sounds.  ?Pulmonary:  ?   Effort: Pulmonary effort is normal.  ?   Breath sounds: Normal breath sounds.  ?Abdominal:  ?   General: Bowel sounds are normal.  ?   Palpations: Abdomen is soft.  ?Musculoskeletal:  ?   Cervical back: Normal range of motion and neck supple.  ?   Right lower leg: No edema.  ?   Left lower leg: No edema.  ?Skin: ?   General: Skin is warm and dry.  ?Neurological:  ?   Mental Status: She is alert and oriented to person, place, and time.  ?Psychiatric:     ?   Mood and Affect: Mood normal.  ? ? ?Labs reviewed: ?Basic Metabolic Panel: ?No results for input(s): NA, K, CL, CO2, GLUCOSE, BUN, CREATININE, CALCIUM, MG, PHOS, TSH in the last 8760 hours. ?Liver Function Tests: ?No results for input(s): AST, ALT, ALKPHOS, BILITOT, PROT, ALBUMIN in the last 8760 hours. ?No results for input(s): LIPASE, AMYLASE in the last 8760 hours. ?No results for input(s): AMMONIA in the last 8760 hours. ?CBC: ?No results for input(s): WBC, NEUTROABS, HGB, HCT, MCV, PLT in the last 8760 hours. ?Lipid Panel: ?No results for input(s): CHOL, HDL, LDLCALC, TRIG, CHOLHDL, LDLDIRECT in the last 8760 hours. ?TSH: ?No results for input(s): TSH in the last 8760 hours. ?A1C: ?No results found for: HGBA1C ? ? ?Assessment/Plan ?1. Essential  hypertension ?-Blood pressure well controlled ?Continue current medications ?Recheck metabolic panel ?- CMP with eGFR([facility]) ?- CBC with Differential/Platelet ?- losartan (COZAAR) 50 MG tablet; Take 1.5 tablets (75 mg total) by mouth daily.  Dispense: 135 tablet; Refill: 2 ? ?2. Hyperlipidemia, unspecified hyperlipidemia type ?-diet controlled, continue lifestyle modifications.  ?- Lipid panel ?- CMP with eGFR([facility]) ? ?3. Overweight with body mass index (BMI) 25.0-29.9 ?-discussed healthy weight through diet and exercise ? ?4. Snores ?-with periods of stopping breath per significant other.  ?- Ambulatory referral to Pulmonology for further evaluation and treatment recommendations  ? ?Return in about 6 months (around 06/07/2022). ?Carlos American. Dewaine Oats, AGNP ? ?[facility] ?[phone]  ?

## 2021-12-09 ENCOUNTER — Other Ambulatory Visit: Payer: Self-pay | Admitting: Nurse Practitioner

## 2021-12-09 DIAGNOSIS — I10 Essential (primary) hypertension: Secondary | ICD-10-CM

## 2021-12-09 NOTE — Telephone Encounter (Signed)
High warning came up when trying to refill medication  Medication pended and sent to Jessica Eubanks, NP 

## 2021-12-11 ENCOUNTER — Ambulatory Visit (INDEPENDENT_AMBULATORY_CARE_PROVIDER_SITE_OTHER): Payer: Self-pay | Admitting: Adult Health

## 2021-12-11 ENCOUNTER — Encounter: Payer: Self-pay | Admitting: Adult Health

## 2021-12-11 VITALS — BP 130/90 | HR 86 | Temp 98.1°F | Ht 66.0 in | Wt 180.8 lb

## 2021-12-11 DIAGNOSIS — R0683 Snoring: Secondary | ICD-10-CM

## 2021-12-11 NOTE — Progress Notes (Signed)
? ?'@Patient'  ID: Kristi Richmond, female    DOB: October 17, 1990, 31 y.o.   MRN: [id number] ? ?Chief Complaint  ?Patient presents with  ? Consult  ? ? ?Referring provider: ?Lauree Chandler, NP ? ?HPI: ?31 year old female seen for sleep consult December 11, 2021 for an, restless sleep and witnessed apneic events  ? ?TEST/EVENTS :  ? ?12/11/2021 Sleep consult  ?Patient presents for a sleep consult today.  She was kindly referred by her primary care provider Sherrie Mustache, NP.  Patient complains that she has snoring, restless sleep, witnessed apneic events. and feels that she stops breathing while sleeping on her back.  Typically goes to bed about 9:30 PM.  Goes to sleep very quickly.  Is up 1 or 2 times at night.  And typically gets up about 5:45 AM.  She does not operate heavy machinery.  Has never been checked for sleep apnea in the past.  She denies any symptoms suspicious for cataplexy or sleep paralysis. ?Caffeine intake 2 cups daily  ?Mom and brother recently diagnosed with OSA .  ?Does not take any sleep aids.  ?She says her significant other notices that she stops breathing and has to hit her several times to roll over.  Epworth score is 2 mainly gets sleepy in the afternoon hours or if she is riding in a car as a passenger. ? ?Medical history hypertension and hyperlipidemia. ? ?Surgical history cholecystectomy in August 2018 ? 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  ?Single. Lives with boyfriend.  ?No smoking . Rare etoh. No drugs.  ? ? ?Allergies  ?Allergen Reactions  ? Sulfa Antibiotics Hives  ? ? ?Immunization History  ?Administered Date(s) Administered  ? DTaP 09/03/1989, 11/02/1989, 01/13/1990, 06/06/1994  ? HPV Quadrivalent 05/04/2007, 03/15/2008, 07/21/2011  ? Hepatitis B 06/04/2001, 07/09/2001, 12/03/2001  ? HiB (PRP-OMP) 09/03/1989, 11/02/1989, 01/13/1990  ? Influenza Inj Mdck Quad With Preservative 05/26/2019  ? Influenza, Quadrivalent, Recombinant, Inj, Pf 05/23/2016  ?  Influenza, Seasonal, Injecte, Preservative Fre 05/28/2015  ? Influenza,inj,Quad PF,6+ Mos 06/20/2018, 06/03/2021  ? Influenza-Unspecified 05/28/2015, 05/26/2017  ? MMR 10/04/1990, 06/06/1994  ? Meningococcal Conjugate 07/01/2004  ? OPV 09/03/1989, 11/02/1989, 01/13/1990, 01/04/1991  ? PFIZER(Purple Top)SARS-COV-2 Vaccination 04/27/2020, 05/18/2020  ? PPD Test 03/14/2014  ? Tdap 11/08/2003, 09/05/2009, 09/19/2016  ? ? ?Past Medical History:  ?Diagnosis Date  ? Cholelithiasis 03/2017  ? Chronic cholecystitis 03/2017  ? Hypertension   ? states under control with med., has been on med. since age 89  ? ? ?Tobacco History: ?Social History  ? ?Tobacco Use  ?Smoking Status Never  ? Passive exposure: Past  ?Smokeless Tobacco Never  ? ?Counseling given: Not Answered ? ? ?Outpatient Medications Prior to Visit  ?Medication Sig Dispense Refill  ? etonogestrel (NEXPLANON) 68 MG IMPL implant 1 each by Subdermal route once. Left arm    ? fluticasone (FLONASE) 50 MCG/ACT nasal spray Place 2 sprays into both nostrils daily. 16 g 6  ? hydrochlorothiazide (HYDRODIURIL) 25 MG tablet Take 1 tablet by mouth once daily 90 tablet 1  ? losartan (COZAAR) 50 MG tablet Take 1.5 tablets (75 mg total) by mouth daily. 135 tablet 2  ? ?No facility-administered medications prior to visit.  ? ? ? ?Review of Systems:  ? ?Constitutional:   No  weight loss, night sweats,  Fevers, chills, fatigue, or  lassitude. ? ?HEENT:   No headaches,  Difficulty swallowing,  Tooth/dental problems, or  Sore throat,  ?  No sneezing, itching, ear ache, nasal congestion, post nasal drip,  ? ?CV:  No chest pain,  Orthopnea, PND, swelling in lower extremities, anasarca, dizziness, palpitations, syncope.  ? ?GI  No heartburn, indigestion, abdominal pain, nausea, vomiting, diarrhea, change in bowel habits, loss of appetite, bloody stools.  ? ?Resp: No shortness of breath with exertion or at rest.  No excess mucus, no productive cough,  No non-productive cough,   No coughing up of blood.  No change in color of mucus.  No wheezing.  No chest wall deformity ? ?Skin: no rash or lesions. ? ?GU: no dysuria, change in color of urine, no urgency or frequency.  No flank pain, no hematuria  ? ?MS:  No joint pain or swelling.  No decreased range of motion.  No back pain. ? ? ? ?Physical Exam ? ?BP 130/90 (BP Location: Left Arm, Patient Position: Sitting, Cuff Size: Large)   Pulse 86   Temp 98.1 ?F (36.7 ?C) (Oral)   Ht '5\' 6"'  (1.676 m)   Wt 180 lb 12.8 oz (82 kg)   SpO2 98%   BMI 29.18 kg/m?  ? ?GEN: A/Ox3; pleasant , NAD, well nourished  ?  ?HEENT:  [place]/AT,   NOSE-clear, THROAT-clear, no lesions, no postnasal drip or exudate noted.  Class II-III MP airway ? ?NECK:  Supple w/ fair ROM; no JVD; normal carotid impulses w/o bruits; no thyromegaly or nodules palpated; no lymphadenopathy.   ? ?RESP  Clear  P & A; w/o, wheezes/ rales/ or rhonchi. no accessory muscle use, no dullness to percussion ? ?CARD:  RRR, no m/r/g, no peripheral edema, pulses intact, no cyanosis or clubbing. ? ?GI:   Soft & nt; nml bowel sounds; no organomegaly or masses detected.  ? ?Musco: Warm bil, no deformities or joint swelling noted.  ? ?Neuro: alert, no focal deficits noted.   ? ?Skin: Warm, no lesions or rashes ? ? ? ?Lab Results: ? ?CBC ?   ?Component Value Date/Time  ? WBC 5.9 12/06/2021 0840  ? RBC 4.23 12/06/2021 0840  ? HGB 13.2 12/06/2021 0840  ? HCT 38.7 12/06/2021 0840  ? PLT 258 12/06/2021 0840  ? MCV 91.5 12/06/2021 0840  ? MCV 91.1 08/28/2015 2020  ? MCH 31.2 12/06/2021 0840  ? MCHC 34.1 12/06/2021 0840  ? RDW 12.3 12/06/2021 0840  ? LYMPHSABS 2,077 12/06/2021 0840  ? MONOABS 0.6 07/20/2014 1710  ? EOSABS 77 12/06/2021 0840  ? BASOSABS 18 12/06/2021 0840  ? ? ?BMET ?   ?Component Value Date/Time  ? NA 138 12/06/2021 0840  ? K 3.8 12/06/2021 0840  ? CL 104 12/06/2021 0840  ? CO2 28 12/06/2021 0840  ? GLUCOSE 81 12/06/2021 0840  ? BUN 7 12/06/2021 0840  ? CREATININE 0.65 12/06/2021 0840  ?  CALCIUM 9.0 12/06/2021 0840  ? GFRNONAA 118 12/03/2020 1017  ? GFRAA 137 12/03/2020 1017  ? ? ?BNP ?No results found for: BNP ? ?ProBNP ?No results found for: PROBNP ? ?Imaging: ?No results found. ? ? ? ?   ? View : No data to display.  ?  ?  ?  ? ? ?No results found for: NITRICOXIDE ? ? ? ? ? ?Assessment & Plan:  ? ?Snoring ?Snoring, witnessed apneic events and restless sleep all suspicious for underlying sleep apnea. ? ?- discussed how weight can impact sleep and risk for sleep disordered breathing ?- discussed options to assist with weight loss: combination of diet modification, cardiovascular and strength training exercises ?  ?-  had an extensive discussion regarding the adverse health consequences related to untreated sleep disordered breathing ?- specifically discussed the risks for hypertension, coronary artery disease, cardiac dysrhythmias, cerebrovascular disease, and diabetes ?- lifestyle modification discussed ?  ?- discussed how sleep disruption can increase risk of accidents, particularly when driving ?- safe driving practices were discussed ?  ? ?Set up for home sleep study ? ?Plan  ?Patient Instructions  ?Set up for home sleep study ?Healthy weight loss ?Healthy sleep regimen ?Follow-up in 6 to 8 weeks to discuss sleep study results ? ? ?  ? ? ? ? ?Rexene Edison, NP ?12/11/2021 ? ?

## 2021-12-11 NOTE — Patient Instructions (Signed)
Set up for home sleep study ?Healthy weight loss ?Healthy sleep regimen ?Follow-up in 6 to 8 weeks to discuss sleep study results ? ? ?

## 2021-12-11 NOTE — Assessment & Plan Note (Signed)
Snoring, witnessed apneic events and restless sleep all suspicious for underlying sleep apnea. ? ?- discussed how weight can impact sleep and risk for sleep disordered breathing ?- discussed options to assist with weight loss: combination of diet modification, cardiovascular and strength training exercises ?  ?- had an extensive discussion regarding the adverse health consequences related to untreated sleep disordered breathing ?- specifically discussed the risks for hypertension, coronary artery disease, cardiac dysrhythmias, cerebrovascular disease, and diabetes ?- lifestyle modification discussed ?  ?- discussed how sleep disruption can increase risk of accidents, particularly when driving ?- safe driving practices were discussed ?  ? ?Set up for home sleep study ? ?Plan  ?Patient Instructions  ?Set up for home sleep study ?Healthy weight loss ?Healthy sleep regimen ?Follow-up in 6 to 8 weeks to discuss sleep study results ? ? ?  ? ?

## 2021-12-23 ENCOUNTER — Institutional Professional Consult (permissible substitution): Payer: Self-pay | Admitting: Adult Health

## 2021-12-27 ENCOUNTER — Ambulatory Visit: Payer: 59

## 2021-12-27 DIAGNOSIS — R0683 Snoring: Secondary | ICD-10-CM

## 2021-12-27 DIAGNOSIS — G4733 Obstructive sleep apnea (adult) (pediatric): Secondary | ICD-10-CM

## 2022-01-01 DIAGNOSIS — G4733 Obstructive sleep apnea (adult) (pediatric): Secondary | ICD-10-CM | POA: Diagnosis not present

## 2022-01-02 ENCOUNTER — Telehealth: Payer: Self-pay | Admitting: Adult Health

## 2022-01-02 NOTE — Telephone Encounter (Signed)
-----   Message from Courtney Paris sent at 01/01/2022  3:24 PM EDT ----- ?Hello this patient's HST report is ready for review ?

## 2022-01-02 NOTE — Telephone Encounter (Signed)
Home sleep study done on Dec 27, 2021 shows severe sleep apnea with AHI at 52.9/hour positive snoring and SPO2 low at 82%.  Please set up office visit to discuss sleep study results and go over treatment plan.  Can be an in office visit or a video visit ?

## 2022-01-03 NOTE — Telephone Encounter (Signed)
Spoke to patient and relayed below results.  Mychart visit scheduled 01/07/2022 at 11:00. ?She voiced her understanding and had no further questions.  ?Nothing further needed.  ?

## 2022-01-07 ENCOUNTER — Encounter: Payer: Self-pay | Admitting: Adult Health

## 2022-01-07 ENCOUNTER — Telehealth (INDEPENDENT_AMBULATORY_CARE_PROVIDER_SITE_OTHER): Payer: 59 | Admitting: Adult Health

## 2022-01-07 DIAGNOSIS — R0683 Snoring: Secondary | ICD-10-CM | POA: Diagnosis not present

## 2022-01-07 DIAGNOSIS — G4733 Obstructive sleep apnea (adult) (pediatric): Secondary | ICD-10-CM | POA: Diagnosis not present

## 2022-01-07 NOTE — Progress Notes (Signed)
Virtual Visit via Video Note ? ?I connected with EMRI SAMPLE on 01/07/22 at 11:00 AM EDT by a video enabled telemedicine application and verified that I am speaking with the correct person using two identifiers. ? ?Location: ?Patient: Home   ?Provider: Office  ?  ?I discussed the limitations of evaluation and management by telemedicine and the availability of in person appointments. The patient expressed understanding and agreed to proceed. ? ?History of Present Illness: ?31 year old female seen for sleep consult December 11, 2021 for restless sleep and witnessed apneic events found to have severe sleep apnea on home sleep study ? ?Today's video visit is a 1 month follow-up.  Patient was seen last visit for sleep consult.  She complained of snoring, restless sleep and witnessed apneic events.  She was set up for home sleep study on Dec 27, 2021 that showed severe sleep apnea with AHI at 52.9/hour and SPO2 low at 82%.  We discussed her sleep study results.  Went over treatment options including weight loss, oral appliance and CPAP.  Due to the severity of sleep apnea.  She will proceed with beginning CPAP therapy.  Patient education was given. ? ?Past Medical History:  ?Diagnosis Date  ? Cholelithiasis 03/2017  ? Chronic cholecystitis 03/2017  ? Hypertension   ? states under control with med., has been on med. since age 1  ? ?Current Outpatient Medications on File Prior to Visit  ?Medication Sig Dispense Refill  ? etonogestrel (NEXPLANON) 68 MG IMPL implant 1 each by Subdermal route once. Left arm    ? hydrochlorothiazide (HYDRODIURIL) 25 MG tablet Take 1 tablet by mouth once daily 90 tablet 1  ? losartan (COZAAR) 50 MG tablet Take 1.5 tablets (75 mg total) by mouth daily. 135 tablet 2  ? fluticasone (FLONASE) 50 MCG/ACT nasal spray Place 2 sprays into both nostrils daily. (Patient not taking: Reported on 01/07/2022) 16 g 6  ? ?No current facility-administered medications on file prior to visit.  ? ? ?   ?Observations/Objective: ?Home sleep study done on Dec 27, 2021 shows severe sleep apnea with AHI at 52.9/hour positive snoring and SPO2 low at 82%. ? ?Assessment and Plan: ?Severe obstructive sleep apnea with nocturnal desaturations.  Patient will begin on CPAP at bedtime.  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  We discussed maybe using a small nasal mask such as DreamWear nasal. ?We will begin auto CPAP 5 to 20 cm H2O. ? ?- discussed how weight can impact sleep and risk for sleep disordered breathing ?- discussed options to assist with weight loss: combination of diet modification, cardiovascular and strength training exercises ?  ?- had an extensive discussion regarding the adverse health consequences related to untreated sleep disordered breathing ?- specifically discussed the risks for hypertension, coronary artery disease, cardiac dysrhythmias, cerebrovascular disease, and diabetes ?- lifestyle modification discussed ?  ?- discussed how sleep disruption can increase risk of accidents, particularly when driving ?- safe driving practices were discussed ?  ?Plan  ?Patient Instructions  ?Begin CPAP at bedtime. ?Wear all night long, at least 6hr or more  ?Work on healthy weight loss.  ?Do not drive if sleepy  ?May like the dream wear nasal mask .  ?Follow up in 3 months and As needed   ?  ? ? ?Follow Up Instructions: ? ?  ?I discussed the assessment and treatment plan with the patient. The patient was provided an opportunity to ask questions and all were answered. The patient agreed with the plan and  demonstrated an understanding of the instructions. ?  ?The patient was advised to call back or seek an in-person evaluation if the symptoms worsen or if the condition fails to improve as anticipated. ? ?I provided 25  minutes of non-face-to-face time during this encounter. ? ? ?Rubye Oaks, NP ? ? ?

## 2022-01-07 NOTE — Progress Notes (Signed)
Reviewed and agree with assessment/plan. ? ? ?Anastasio Wogan, MD ?[facility] ?01/07/2022, 1:14 PM ?Pager:  [phone] ? ?

## 2022-01-07 NOTE — Patient Instructions (Addendum)
Begin CPAP at bedtime. ?Wear all night long, at least 6hr or more  ?Work on healthy weight loss.  ?Do not drive if sleepy  ?May like the dream wear nasal mask .  ?Follow up in 3 months and As needed   ? ?

## 2022-01-07 NOTE — Progress Notes (Signed)
Received 2 covid vaccines [employer]. ?

## 2022-03-12 ENCOUNTER — Ambulatory Visit: Payer: 59 | Admitting: Adult Health

## 2022-03-20 ENCOUNTER — Other Ambulatory Visit: Payer: Self-pay | Admitting: Nurse Practitioner

## 2022-03-20 DIAGNOSIS — I10 Essential (primary) hypertension: Secondary | ICD-10-CM

## 2022-03-27 ENCOUNTER — Encounter: Payer: Self-pay | Admitting: Adult Health

## 2022-03-27 ENCOUNTER — Ambulatory Visit: Payer: 59 | Admitting: Adult Health

## 2022-03-27 DIAGNOSIS — G4733 Obstructive sleep apnea (adult) (pediatric): Secondary | ICD-10-CM

## 2022-03-27 NOTE — Progress Notes (Signed)
Reviewed and agree with assessment/plan.   Jemmie Rhinehart, MD [facility] 03/27/2022, 9:55 AM Pager:  [phone]  

## 2022-03-27 NOTE — Progress Notes (Signed)
_0  ID: Kristi Richmond, female    DOB: 10/10/1990, 31 y.o.   MRN: [id number]  Chief Complaint  Patient presents with   Follow-up    Referring provider: Lauree Chandler, NP  HPI: 31 yo female followed for Severe OSA  Seen for Sleep consult 12/11/21 with restless sleep , HST showed severe OSA   TEST/EVENTS :  home sleep study on Dec 27, 2021 that showed severe sleep apnea with AHI at 52.9/hour and SPO2 low at 82%.  03/27/2022 Follow up : OSA  Patient presents for 69-monthfollow-up.  Patient was seen December 11, 2021 for sleep consult with restless sleep and witnessed apneic events.  Was set up for home sleep study completed on Dec 27, 2021 that showed severe sleep apnea with AHI 52.9/hour and SPO2 low at 82%.  Patient was started on CPAP.  Patient says she is doing better.  She is getting used to the CPAP and feels that she is benefiting from CPAP with decreased daytime sleepiness and improved sleep regimen.  CPAP download shows 90% compliance.  Daily average usage at 6 hours.  Patient is on auto CPAP 5 to 20 cm H2O.  AHI 0.5, daily average pressure at 11.5 cm of H2O. DME [employer]. Uses dream wear nasal mask.    Allergies  Allergen Reactions   Sulfa Antibiotics Hives    Immunization History  Administered Date(s) Administered   DTaP 09/03/1989, 11/02/1989, 01/13/1990, 06/06/1994   HPV Quadrivalent 05/04/2007, 03/15/2008, 07/21/2011   Hepatitis B 06/04/2001, 07/09/2001, 12/03/2001   HiB (PRP-OMP) 09/03/1989, 11/02/1989, 01/13/1990   Influenza Inj Mdck Quad With Preservative 05/26/2019   Influenza, Quadrivalent, Recombinant, Inj, Pf 05/23/2016   Influenza, Seasonal, Injecte, Preservative Fre 05/28/2015   Influenza,inj,Quad PF,6+ Mos 06/20/2018, 05/24/2021, 06/03/2021   Influenza-Unspecified 05/28/2015, 05/26/2017, 06/20/2018   MMR 10/04/1990, 06/06/1994   Meningococcal Conjugate 07/01/2004   OPV 09/03/1989, 11/02/1989, 01/13/1990, 01/04/1991   PFIZER(Purple Top)SARS-COV-2  Vaccination 04/27/2020, 05/18/2020   PPD Test 03/14/2014   Tdap 11/08/2003, 09/05/2009, 09/19/2016    Past Medical History:  Diagnosis Date   Cholelithiasis 03/2017   Chronic cholecystitis 03/2017   Hypertension    states under control with med., has been on med. since age 31   Tobacco History: Social History   Tobacco Use  Smoking Status Never   Passive exposure: Past  Smokeless Tobacco Never   Counseling given: Not Answered   Outpatient Medications Prior to Visit  Medication Sig Dispense Refill   etonogestrel (NEXPLANON) 68 MG IMPL implant 1 each by Subdermal route once. Left arm     hydrochlorothiazide (HYDRODIURIL) 25 MG tablet Take 1 tablet by mouth once daily 90 tablet 1   losartan (COZAAR) 50 MG tablet Take 1.5 tablets (75 mg total) by mouth daily. 135 tablet 2   fluticasone (FLONASE) 50 MCG/ACT nasal spray Place 2 sprays into both nostrils daily. (Patient not taking: Reported on 01/07/2022) 16 g 6   No facility-administered medications prior to visit.     Review of Systems:   Constitutional:   No  weight loss, night sweats,  Fevers, chills, fatigue, or  lassitude.  HEENT:   No headaches,  Difficulty swallowing,  Tooth/dental problems, or  Sore throat,                No sneezing, itching, ear ache, nasal congestion, post nasal drip,   CV:  No chest pain,  Orthopnea, PND, swelling in lower extremities, anasarca, dizziness, palpitations, syncope.   GI  No heartburn, indigestion, abdominal  pain, nausea, vomiting, diarrhea, change in bowel habits, loss of appetite, bloody stools.   Resp: No shortness of breath with exertion or at rest.  No excess mucus, no productive cough,  No non-productive cough,  No coughing up of blood.  No change in color of mucus.  No wheezing.  No chest wall deformity  Skin: no rash or lesions.  GU: no dysuria, change in color of urine, no urgency or frequency.  No flank pain, no hematuria   MS:  No joint pain or swelling.  No decreased  range of motion.  No back pain.    Physical Exam  BP 132/86 (BP Location: Left Arm, Patient Position: Sitting, Cuff Size: Normal)   Pulse 80   Temp 98.6 F (37 C) (Oral)   Ht _0  (1.676 m)   Wt 187 lb 12.8 oz (85.2 kg)   SpO2 98%   BMI 30.31 kg/m   GEN: A/Ox3; pleasant , NAD, well nourished    HEENT:  Startup/AT,  NOSE-clear, THROAT-clear, no lesions, no postnasal drip or exudate noted.  Class II-III MP airway  NECK:  Supple w/ fair ROM; no JVD; normal carotid impulses w/o bruits; no thyromegaly or nodules palpated; no lymphadenopathy.    RESP  Clear  P & A; w/o, wheezes/ rales/ or rhonchi. no accessory muscle use, no dullness to percussion  CARD:  RRR, no m/r/g, no peripheral edema, pulses intact, no cyanosis or clubbing.  GI:   Soft & nt; nml bowel sounds; no organomegaly or masses detected.   Musco: Warm bil, no deformities or joint swelling noted.   Neuro: alert, no focal deficits noted.    Skin: Warm, no lesions or rashes    Lab Results:  CBC    Component Value Date/Time   WBC 5.9 12/06/2021 0840   RBC 4.23 12/06/2021 0840   HGB 13.2 12/06/2021 0840   HCT 38.7 12/06/2021 0840   PLT 258 12/06/2021 0840   MCV 91.5 12/06/2021 0840   MCV 91.1 08/28/2015 2020   MCH 31.2 12/06/2021 0840   MCHC 34.1 12/06/2021 0840   RDW 12.3 12/06/2021 0840   LYMPHSABS 2,077 12/06/2021 0840   MONOABS 0.6 07/20/2014 1710   EOSABS 77 12/06/2021 0840   BASOSABS 18 12/06/2021 0840    BMET    Component Value Date/Time   NA 138 12/06/2021 0840   K 3.8 12/06/2021 0840   CL 104 12/06/2021 0840   CO2 28 12/06/2021 0840   GLUCOSE 81 12/06/2021 0840   BUN 7 12/06/2021 0840   CREATININE 0.65 12/06/2021 0840   CALCIUM 9.0 12/06/2021 0840   GFRNONAA 118 12/03/2020 1017   GFRAA 137 12/03/2020 1017    BNP No results found for: "BNP"  ProBNP No results found for: "PROBNP"  Imaging: No results found.        No data to display          No results found for:  "NITRICOXIDE"      Assessment & Plan:   OSA (obstructive sleep apnea) Severe obstructive sleep apnea.  Patient has excellent control compliance on CPAP.  Patient's had clinical benefit on CPAP.  Continue on CPAP at bedtime.  We will adjust CPAP pressure to 5-15 for comfort   Plan Patient Instructions  Continue on CPAP at bedtime. Decrease CPAP pressure to CPAP auto 5-15 cmH2O.  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  Do not drive if sleepy  Follow up in 6 months and As needed         Rexene Edison, NP 03/27/2022

## 2022-03-27 NOTE — Assessment & Plan Note (Addendum)
Severe obstructive sleep apnea.  Patient has excellent control compliance on CPAP.  Patient's had clinical benefit on CPAP.  Continue on CPAP at bedtime.  We will adjust CPAP pressure to 5-15 for comfort   Plan Patient Instructions  Continue on CPAP at bedtime. Decrease CPAP pressure to CPAP auto 5-15 cmH2O.  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  Do not drive if sleepy  Follow up in 6 months and As needed

## 2022-03-27 NOTE — Patient Instructions (Addendum)
Continue on CPAP at bedtime. Decrease CPAP pressure to CPAP auto 5-15 cmH2O.  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  Do not drive if sleepy  Follow up in 6 months and As needed

## 2022-06-09 ENCOUNTER — Encounter: Payer: Self-pay | Admitting: Nurse Practitioner

## 2022-06-09 ENCOUNTER — Ambulatory Visit: Payer: 59 | Admitting: Nurse Practitioner

## 2022-06-09 VITALS — BP 128/88 | HR 80 | Temp 97.8°F | Ht 66.0 in | Wt 187.6 lb

## 2022-06-09 DIAGNOSIS — G4733 Obstructive sleep apnea (adult) (pediatric): Secondary | ICD-10-CM | POA: Diagnosis not present

## 2022-06-09 DIAGNOSIS — I10 Essential (primary) hypertension: Secondary | ICD-10-CM | POA: Diagnosis not present

## 2022-06-09 DIAGNOSIS — Z23 Encounter for immunization: Secondary | ICD-10-CM

## 2022-06-09 DIAGNOSIS — E785 Hyperlipidemia, unspecified: Secondary | ICD-10-CM | POA: Diagnosis not present

## 2022-06-09 DIAGNOSIS — E663 Overweight: Secondary | ICD-10-CM | POA: Diagnosis not present

## 2022-06-09 NOTE — Progress Notes (Signed)
Careteam: Patient Care Team: Sharon Seller, NP as PCP - General (Geriatric Medicine) Darnell Level, MD as Consulting Physician (General Surgery) Philip Aspen, DO as Consulting Physician (Obstetrics and Gynecology)  PLACE OF SERVICE:  [facility]  Advanced Directive information    Allergies  Allergen Reactions   Sulfa Antibiotics Hives    Chief Complaint  Patient presents with   Medical Management of Chronic Issues    Patient presents today for a 6 month follow-up     HPI: Patient is a 31 y.o. female   Here today for routine follow up. Has no acute complaints.  Cholesterol mildly elevated on last labs- discussed that cholesterol was elevated at last visit and that we will recheck at this visit. Denies family history of stroke or heart attack.   OSA: Recently started wearing CPAP at night. She is is sleeping better at night and blood pressure is better controlled.   Has nexplanon implanted device and follows with gynecology.   HTN: Controlled. Continues Cozaar and hydrochlorothiazide.  Pt questioned if she could come down on blood pressure medication, discussed that we would like to see better control before attempting to wean off BP medications.    Review of Systems:  Review of Systems  Constitutional:  Negative for fever and weight loss.  HENT:  Negative for hearing loss.   Eyes:  Negative for blurred vision.  Respiratory:  Negative for cough, shortness of breath and wheezing.   Cardiovascular:  Negative for chest pain, palpitations and leg swelling.  Gastrointestinal:  Negative for abdominal pain, constipation, diarrhea and heartburn.  Genitourinary:  Negative for dysuria and hematuria.  Musculoskeletal:  Negative for back pain and myalgias.  Skin:  Negative for itching and rash.  Neurological:  Negative for dizziness and headaches.  Psychiatric/Behavioral:  Negative for depression. The patient is not nervous/anxious.     Past Medical History:   Diagnosis Date   Cholelithiasis 03/2017   Chronic cholecystitis 03/2017   Hypertension    states under control with med., has been on med. since age 33   Past Surgical History:  Procedure Laterality Date   CHOLECYSTECTOMY     CHOLECYSTECTOMY N/A 04/16/2017   Procedure: LAPAROSCOPIC CHOLECYSTECTOMY WITH INTRAOPERATIVE CHOLANGIOGRAM;  Surgeon: Darnell Level, MD;  Location: [facility];  Service: General;  Laterality: N/A;   DERMOID CYST  EXCISION Left 12/19/2003   post-auricular   ERCP N/A 04/17/2017   Procedure: ENDOSCOPIC RETROGRADE CHOLANGIOPANCREATOGRAPHY (ERCP);  Surgeon: Vida Rigger, MD;  Location[facility] END[facility]COPY;  Service: Endoscopy;  Laterality: N/A;   WISDOM TOOTH EXTRACTION  2008   Social History:   reports that she has never smoked. She has been exposed to tobacco smoke. She has never used smokeless tobacco. She reports current alcohol use. She reports that she does not use drugs.  Family History  Problem Relation Age of Onset   Hypertension Mother    Sleep apnea Mother    High Cholesterol Mother    Diabetes Father    Pancreatic cancer Maternal Grandmother    Stroke Maternal Aunt     Medications: Patient's Medications  New Prescriptions   No medications on file  Previous Medications   ETONOGESTREL (NEXPLANON) 68 MG IMPL IMPLANT    1 each by Subdermal route once. Left arm   HYDROCHLOROTHIAZIDE (HYDRODIURIL) 25 MG TABLET    Take 1 tablet by mouth once daily   LOSARTAN (COZAAR) 50 MG TABLET    Take 1.5 tablets (75 mg total) by mouth daily.  Modified Medications   No medications on file  Discontinued Medications   FLUTICASONE (FLONASE) 50 MCG/ACT NASAL SPRAY    Place 2 sprays into both nostrils daily.    Physical Exam:  Vitals:   06/09/22 0904  BP: 128/88  Pulse: 80  Temp: 97.8 F (36.6 C)  SpO2: 99%  Weight: 187 lb 9.6 oz (85.1 kg)  Height: 5\' 6"  (1.676 m)   Body mass index is 30.28 kg/m. Wt Readings from Last 3 Encounters:  06/09/22 187 lb  9.6 oz (85.1 kg)  03/27/22 187 lb 12.8 oz (85.2 kg)  12/11/21 180 lb 12.8 oz (82 kg)    Physical Exam Constitutional:      General: She is not in acute distress. HENT:     Right Ear: There is no impacted cerumen.     Left Ear: There is no impacted cerumen.  Cardiovascular:     Rate and Rhythm: Normal rate and regular rhythm.     Pulses: Normal pulses.     Heart sounds: Normal heart sounds. No murmur heard. Pulmonary:     Effort: Pulmonary effort is normal. No respiratory distress.     Breath sounds: Normal breath sounds. No wheezing.  Abdominal:     General: Bowel sounds are normal. There is no distension.     Palpations: Abdomen is soft.     Tenderness: There is no abdominal tenderness.  Musculoskeletal:     Right lower leg: No edema.     Left lower leg: No edema.  Neurological:     Mental Status: She is alert and oriented to person, place, and time. Mental status is at baseline.     Labs reviewed: Basic Metabolic Panel: Recent Labs    12/06/21 0840  NA 138  K 3.8  CL 104  CO2 28  GLUCOSE 81  BUN 7  CREATININE 0.65  CALCIUM 9.0   Liver Function Tests: Recent Labs    12/06/21 0840  AST 16  ALT 16  BILITOT 0.4  PROT 6.9   No results for input(s): "LIPASE", "AMYLASE" in the last 8760 hours. No results for input(s): "AMMONIA" in the last 8760 hours. CBC: Recent Labs    12/06/21 0840  WBC 5.9  NEUTROABS 3,339  HGB 13.2  HCT 38.7  MCV 91.5  PLT 258   Lipid Panel: Recent Labs    12/06/21 0840  CHOL 195  HDL 36*  LDLCALC 136*  TRIG 123  CHOLHDL 5.4*   TSH: No results for input(s): "TSH" in the last 8760 hours. A1C: No results found for: "HGBA1C"   Assessment/Plan 1. OSA (obstructive sleep apnea) - Wears CPAP at night - Follows with pulmonology   2. Essential hypertension - controlled  - continue hydrochlorothiazide and Cozaar and dietary modifications    3. Overweight with body mass index (BMI) 25.0-29.9 - Encourage lifestlyle  modifications, including exercise and low- carb, low fat diet  4. Hyperlipidemia, unspecified hyperlipidemia type - Encourage lifestyle modifications, including exercise and low-fat, low-cholesterol diet  - Lipid panel   5. Need for influenza vaccination - received in office today    Return in about 1 year (around 06/10/2023) for CPE .:  Student- Waunita Schooner, RN -I personally was present during the history, physical exam and medical decision-making activities of this service and have verified that the service and findings are accurately documented in the student's note Partick Musselman K. [place], [facility] [phone]

## 2022-06-10 LAB — COMPLETE METABOLIC PANEL WITH GFR
AG Ratio: 1.7 (calc) (ref 1.0–2.5)
ALT: 18 U/L (ref 6–29)
AST: 15 U/L (ref 10–30)
Albumin: 4.5 g/dL (ref 3.6–5.1)
Alkaline phosphatase (APISO): 52 U/L (ref 31–125)
BUN: 9 mg/dL (ref 7–25)
CO2: 26 mmol/L (ref 20–32)
Calcium: 9.4 mg/dL (ref 8.6–10.2)
Chloride: 105 mmol/L (ref 98–110)
Creat: 0.71 mg/dL (ref 0.50–0.97)
Globulin: 2.7 g/dL (calc) (ref 1.9–3.7)
Glucose, Bld: 84 mg/dL (ref 65–99)
Potassium: 4.2 mmol/L (ref 3.5–5.3)
Sodium: 139 mmol/L (ref 135–146)
Total Bilirubin: 0.4 mg/dL (ref 0.2–1.2)
Total Protein: 7.2 g/dL (ref 6.1–8.1)
eGFR: 116 mL/min/{1.73_m2} (ref >=60)

## 2022-06-10 LAB — LIPID PANEL
Cholesterol: 195 mg/dL (ref <200)
HDL: 39 mg/dL — ABNORMAL LOW (ref >=50)
LDL Cholesterol (Calc): 136 mg/dL (calc) — ABNORMAL HIGH
Non-HDL Cholesterol (Calc): 156 mg/dL (calc) — ABNORMAL HIGH (ref <130)
Total CHOL/HDL Ratio: 5 (calc) — ABNORMAL HIGH (ref <5.0)
Triglycerides: 94 mg/dL (ref <150)

## 2022-06-17 ENCOUNTER — Other Ambulatory Visit: Payer: Self-pay | Admitting: Family

## 2022-06-17 DIAGNOSIS — I10 Essential (primary) hypertension: Secondary | ICD-10-CM

## 2022-06-18 NOTE — Telephone Encounter (Signed)
High risk or very high risk warning populated when attempting to refill medication. RX request sent to PCP for review and approval if warranted.   

## 2022-07-28 ENCOUNTER — Other Ambulatory Visit: Payer: Self-pay | Admitting: Nurse Practitioner

## 2022-07-28 DIAGNOSIS — I10 Essential (primary) hypertension: Secondary | ICD-10-CM

## 2022-07-28 NOTE — Telephone Encounter (Signed)
Pharmacy requested refill. Pended Rx and sent to Jessica for approval due to HIGH ALERT Warning.  

## 2022-09-29 ENCOUNTER — Ambulatory Visit: Payer: 59 | Admitting: Adult Health

## 2022-09-30 ENCOUNTER — Ambulatory Visit (INDEPENDENT_AMBULATORY_CARE_PROVIDER_SITE_OTHER): Payer: 59 | Admitting: Adult Health

## 2022-09-30 ENCOUNTER — Encounter: Payer: Self-pay | Admitting: Adult Health

## 2022-09-30 VITALS — BP 118/70 | HR 85 | Temp 98.1°F | Ht 66.0 in | Wt 184.8 lb

## 2022-09-30 DIAGNOSIS — E669 Obesity, unspecified: Secondary | ICD-10-CM | POA: Insufficient documentation

## 2022-09-30 DIAGNOSIS — G4733 Obstructive sleep apnea (adult) (pediatric): Secondary | ICD-10-CM

## 2022-09-30 NOTE — Progress Notes (Signed)
@Patient  ID: Kristi Richmond, female    DOB: [birth year], 32 y.o.   MRN: [id number]  Chief Complaint  Patient presents with   Follow-up    Referring provider: Lauree Chandler, NP  HPI: 32 year old female followed for severe sleep apnea Seen for sleep consult December 11, 2021 with restless sleep set up for home sleep study that showed severe sleep apnea  TEST/EVENTS :  home sleep study on Dec 27, 2021 that showed severe sleep apnea with AHI at 52.9/hour and SPO2 low at 82%.   09/30/2022 Follow up: OSA Patient presents for a 56-month follow-up.  Patient has underlying severe sleep apnea.  She was started on CPAP therapy last year.  Patient says she is doing well on CPAP.  She feels that she has decreased daytime sleepiness and is benefiting from CPAP.  She wears her CPAP every night for at least 7 hours.  Previous sleep study showed severe sleep apnea with a AHI 52.9/hour.  Patient is currently using the DreamWear nasal mask.  She gets her supplies from the DME [facility].  CPAP download shows excellent compliance with daily average usage at 7 hours.  Patient is on auto CPAP 5 to 15 cm H2O.  AHI 0.3/hour.  Daily average pressure at 10.5 cm H2O. Blood pressures have been better since starting on CPAP   Allergies  Allergen Reactions   Sulfa Antibiotics Hives    Immunization History  Administered Date(s) Administered   DTaP 09/03/1989, 11/02/1989, 01/13/1990, 06/06/1994   HIB (PRP-OMP) 09/03/1989, 11/02/1989, 01/13/1990   HPV Quadrivalent 05/04/2007, 03/15/2008, 07/21/2011   Hepatitis B 06/04/2001, 07/09/2001, 12/03/2001   Influenza Inj Mdck Quad With Preservative 05/26/2019   Influenza, Quadrivalent, Recombinant, Inj, Pf 05/23/2016   Influenza, Seasonal, Injecte, Preservative Fre 05/28/2015   Influenza,inj,Quad PF,6+ Mos 06/20/2018, 05/24/2021, 06/03/2021, 06/09/2022   Influenza-Unspecified 05/28/2015, 05/26/2017, 06/20/2018   MMR 10/04/1990, 06/06/1994   Meningococcal Conjugate 07/01/2004    OPV 09/03/1989, 11/02/1989, 01/13/1990, 01/04/1991   PFIZER(Purple Top)SARS-COV-2 Vaccination 04/27/2020, 05/18/2020   PPD Test 03/14/2014   Tdap 11/08/2003, 09/05/2009, 09/19/2016    Past Medical History:  Diagnosis Date   Cholelithiasis 03/2017   Chronic cholecystitis 03/2017   Hypertension    states under control with med., has been on med. since age 61    Tobacco History: Social History   Tobacco Use  Smoking Status Never   Passive exposure: Past  Smokeless Tobacco Never   Counseling given: Not Answered   Outpatient Medications Prior to Visit  Medication Sig Dispense Refill   etonogestrel (NEXPLANON) 68 MG IMPL implant 1 each by Subdermal route once. Left arm     hydrochlorothiazide (HYDRODIURIL) 25 MG tablet Take 1 tablet by mouth once daily 90 tablet 1   losartan (COZAAR) 50 MG tablet TAKE 1 & 1/2 (ONE & ONE-HALF) TABLETS BY MOUTH ONCE DAILY 135 tablet 1   No facility-administered medications prior to visit.     Review of Systems:   Constitutional:   No  weight loss, night sweats,  Fevers, chills, fatigue, or  lassitude.  HEENT:   No headaches,  Difficulty swallowing,  Tooth/dental problems, or  Sore throat,                No sneezing, itching, ear ache, nasal congestion, post nasal drip,   CV:  No chest pain,  Orthopnea, PND, swelling in lower extremities, anasarca, dizziness, palpitations, syncope.   GI  No heartburn, indigestion, abdominal pain, nausea, vomiting, diarrhea, change in bowel habits, loss of appetite, bloody  stools.   Resp: No shortness of breath with exertion or at rest.  No excess mucus, no productive cough,  No non-productive cough,  No coughing up of blood.  No change in color of mucus.  No wheezing.  No chest wall deformity  Skin: no rash or lesions.  GU: no dysuria, change in color of urine, no urgency or frequency.  No flank pain, no hematuria   MS:  No joint pain or swelling.  No decreased range of motion.  No back  pain.    Physical Exam  BP 118/70 (BP Location: Right Arm, Patient Position: Sitting, Cuff Size: Large)   Pulse 85   Temp 98.1 F (36.7 C) (Oral)   Ht 5\' 6"  (1.676 m)   Wt 184 lb 12.8 oz (83.8 kg)   SpO2 100%   BMI 29.83 kg/m   GEN: A/Ox3; pleasant , NAD, well nourished    HEENT:  [place]/AT,  NOSE-clear, THROAT-clear, no lesions, no postnasal drip or exudate noted.  Class 3 MP airway   NECK:  Supple w/ fair ROM; no JVD; normal carotid impulses w/o bruits; no thyromegaly or nodules palpated; no lymphadenopathy.    RESP  Clear  P & A; w/o, wheezes/ rales/ or rhonchi. no accessory muscle use, no dullness to percussion  CARD:  RRR, no m/r/g, no peripheral edema, pulses intact, no cyanosis or clubbing.  GI:   Soft & nt; nml bowel sounds; no organomegaly or masses detected.   Musco: Warm bil, no deformities or joint swelling noted.   Neuro: alert, no focal deficits noted.    Skin: Warm, no lesions or rashes    Lab Results:  CBC     BNP No results found for: "BNP"  ProBNP No results found for: "PROBNP"  Imaging: No results found.        No data to display          No results found for: "NITRICOXIDE"      Assessment & Plan:   OSA (obstructive sleep apnea) Excellent control and compliance on CPAP.  Continue on CPAP same pressures.  Plan  Patient Instructions  Continue on CPAP at bedtime. 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  Do not drive if sleepy  Follow up in 1 year for year and As needed      Obesity Healthy weight loss      Rexene Edison, NP 09/30/2022

## 2022-09-30 NOTE — Patient Instructions (Signed)
Continue on CPAP at bedtime. 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  Do not drive if sleepy  Follow up in 1 year for year and As needed

## 2022-09-30 NOTE — Assessment & Plan Note (Signed)
Excellent control and compliance on CPAP.  Continue on CPAP same pressures.  Plan  Patient Instructions  Continue on CPAP at bedtime. 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  Do not drive if sleepy  Follow up in 1 year for year and As needed

## 2022-09-30 NOTE — Assessment & Plan Note (Signed)
Healthy weight loss 

## 2022-09-30 NOTE — Progress Notes (Signed)
Reviewed and agree with assessment/plan.   Chesley Mires, MD [facility] Pulmonary/Critical Care 09/30/2022, 8:59 AM Pager:  [phone]

## 2022-11-19 LAB — HM PAP SMEAR

## 2023-03-24 ENCOUNTER — Other Ambulatory Visit: Payer: Self-pay | Admitting: Nurse Practitioner

## 2023-03-24 DIAGNOSIS — I10 Essential (primary) hypertension: Secondary | ICD-10-CM

## 2023-03-24 NOTE — Telephone Encounter (Signed)
Patient medication has High Risk Warnings. Medication pend and sent to PCP Sharon Seller, NP. Patient given 90 day supply until appointment.

## 2023-04-12 ENCOUNTER — Telehealth: Payer: 59 | Admitting: Nurse Practitioner

## 2023-04-12 DIAGNOSIS — B3731 Acute candidiasis of vulva and vagina: Secondary | ICD-10-CM

## 2023-04-12 MED ORDER — FLUCONAZOLE 150 MG PO TABS: 150.0000 mg | ORAL_TABLET | Freq: Once | ORAL | 0 refills | Status: AC

## 2023-04-12 NOTE — Progress Notes (Signed)

## 2023-04-12 NOTE — Progress Notes (Signed)
I have spent 5 minutes in review of e-visit questionnaire, review and updating patient chart, medical decision making and response to patient.  ° °Zelda W Fleming, NP ° °  °

## 2023-05-21 ENCOUNTER — Other Ambulatory Visit: Payer: Self-pay | Admitting: Nurse Practitioner

## 2023-05-21 DIAGNOSIS — I10 Essential (primary) hypertension: Secondary | ICD-10-CM

## 2023-05-21 NOTE — Telephone Encounter (Signed)
High risk or very high risk warning populated when attempting to refill medication. RX request sent to PCP for review and approval if warranted.

## 2023-06-11 ENCOUNTER — Encounter: Payer: 59 | Admitting: Nurse Practitioner

## 2023-07-20 ENCOUNTER — Ambulatory Visit: Payer: Self-pay | Admitting: Nurse Practitioner

## 2023-07-20 VITALS — BP 118/82 | HR 86 | Temp 98.0°F | Resp 16 | Ht 66.0 in | Wt 188.8 lb

## 2023-07-20 DIAGNOSIS — E66811 Obesity, class 1: Secondary | ICD-10-CM

## 2023-07-20 DIAGNOSIS — I10 Essential (primary) hypertension: Secondary | ICD-10-CM | POA: Diagnosis not present

## 2023-07-20 DIAGNOSIS — Z6830 Body mass index (BMI) 30.0-30.9, adult: Secondary | ICD-10-CM

## 2023-07-20 DIAGNOSIS — G4733 Obstructive sleep apnea (adult) (pediatric): Secondary | ICD-10-CM

## 2023-07-20 DIAGNOSIS — Z0000 Encounter for general adult medical examination without abnormal findings: Secondary | ICD-10-CM | POA: Diagnosis not present

## 2023-07-20 DIAGNOSIS — E785 Hyperlipidemia, unspecified: Secondary | ICD-10-CM

## 2023-07-20 DIAGNOSIS — Z23 Encounter for immunization: Secondary | ICD-10-CM | POA: Diagnosis not present

## 2023-07-20 NOTE — Progress Notes (Signed)
Provider: Sharon Seller, NP  Patient Care Team: Sharon Seller, NP as PCP - General (Geriatric Medicine) Darnell Level, MD as Consulting Physician (General Surgery) Philip Aspen, DO as Consulting Physician (Obstetrics and Gynecology)  Extended Emergency Contact Information Primary Emergency Contact: [facility] Address: [address]          [place], [place] [address] [place] Home Phone: [phone] Relation: Mother Secondary Emergency Contact: Mitchell,Ricardo Address: [address]          [place], [place] [address] [place] Mobile Phone: [phone] Relation: Significant other Allergies  Allergen Reactions   Sulfa Antibiotics Hives   Code Status: FULL  Goals of Care: Advanced Directive information    07/20/2023    8:18 AM  Advanced Directives  Does Patient Have a Medical Advance Directive? No  Would patient like information on creating a medical advance directive? No - Patient declined     Chief Complaint  Patient presents with   Annual Exam    PHYSICAL    Immunizations    Discuss the need for Influenza vaccine, and Covid Booster.     HPI: Patient is a 32 y.o. female seen in today for an wellness exam at [facility]  She is wearing CPAP  She was walking until it got cold     12/06/2021    8:08 AM 06/03/2021    8:57 AM 12/03/2020    9:25 AM 02/24/2020    1:13 PM 08/28/2015    7:32 PM  Depression screen PHQ 2/9  Decreased Interest 0 0 0 0 0  Down, Depressed, Hopeless 0 0 0 0 0  PHQ - 2 Score 0 0 0 0 0       12/03/2020    9:25 AM 06/03/2021    8:57 AM 09/04/2021    3:38 PM 12/06/2021    8:08 AM 07/20/2023    8:18 AM  Fall Risk  Falls in the past year? 0 0 0 0 0  Was there an injury with Fall? 0 0 0 0 0  Fall Risk Category Calculator 0 0 0 0 0  Fall Risk Category (Retired) Low Low Low Low   (RETIRED) Patient Fall Risk Level Low fall risk Low fall risk Low fall risk Low fall risk   Patient at Risk for Falls Due to  No  Fall Risks No Fall Risks No Fall Risks No Fall Risks  Fall risk Follow up  Falls evaluation completed Falls evaluation completed Falls evaluation completed Falls evaluation completed;Education provided;Falls prevention discussed       No data to display           Health Maintenance  Topic Date Due   COVID-19 Vaccine (3 - 2023-24 season) 04/26/2023   Cervical Cancer Screening (HPV/Pap Cotest)  11/18/2025   DTaP/Tdap/Td (8 - Td or Tdap) 09/19/2026   INFLUENZA VACCINE  Completed   HPV VACCINES  Completed   Hepatitis C Screening  Completed   HIV Screening  Completed    Past Medical History:  Diagnosis Date   Cholelithiasis 03/2017   Chronic cholecystitis 03/2017   Hypertension    states under control with med., has been on med. since age 18    Past Surgical History:  Procedure Laterality Date   CHOLECYSTECTOMY     CHOLECYSTECTOMY N/A 04/16/2017   Procedure: LAPAROSCOPIC CHOLECYSTECTOMY WITH INTRAOPERATIVE CHOLANGIOGRAM;  Surgeon: Darnell Level, MD;  Location: [facility];  Service: General;  Laterality: N/A;   DERMOID CYST  EXCISION  Left 12/19/2003   post-auricular   ERCP N/A 04/17/2017   Procedure: ENDOSCOPIC RETROGRADE CHOLANGIOPANCREATOGRAPHY (ERCP);  Surgeon: Vida Rigger, MD;  [facility];  Service: Endoscopy;  Laterality: N/A;   WISDOM TOOTH EXTRACTION  2008    Social History   Socioeconomic History   Marital status: Single    Spouse name: Not on file   Number of children: Not on file   Years of education: Not on file   Highest education level: Associate degree: academic program  Occupational History   Not on file  Tobacco Use   Smoking status: Never    Passive exposure: Past   Smokeless tobacco: Never  Vaping Use   Vaping status: Never Used  Substance and Sexual Activity   Alcohol use: Yes    Comment: occasionally    Drug use: No   Sexual activity: Yes    Birth control/protection: Implant  Other Topics Concern   Not on file   Social History Narrative   Tobacco use, amount per day now: N/A   Past tobacco use, amount per day: N/A   How many years did you use tobacco: N/A   Alcohol use (drinks per week): 1 glass of wine.   Diet:   Do you drink/eat things with caffeine: Yes.   Marital status: Single.                                What year were you married?   Do you live in a house, apartment, assisted living, condo, trailer, etc.? House.   Is it one or more stories? 1   How many persons live in your home? 4   Do you have pets in your home?( please list) No.   Highest Level of education completed: Associate.   Current or past profession:   Do you exercise?  No.                                   Type and how often? No.   Do you have a living will? No.   Do you have a DNR form?  No.                               If not, do you want to discuss one?   Do you have signed POA/HPOA forms? No.                       If so, please bring to you appointment    Do you have any difficulty bathing or dressing yourself? No.   Do you have difficulty preparing food or eating? No.   Do you have difficulty managing your medications? No.   Do you have any difficulty managing your finances? No.   Do you have any difficulty affording your medications? No.       Social Determinants of Health   Financial Resource Strain: Low Risk  (07/20/2023)   Overall Financial Resource Strain (CARDIA)    Difficulty of Paying Living Expenses: Not very hard  Food Insecurity: No Food Insecurity (07/20/2023)   Hunger Vital Sign    Worried About Running Out of Food in the Last Year: Never true    Ran Out of Food in the Last Year: Never true  Transportation Needs: No Transportation Needs (  07/20/2023)   PRAPARE - Administrator, Civil Service (Medical): No    Lack of Transportation (Non-Medical): No  Physical Activity: Insufficiently Active (07/20/2023)   Exercise Vital Sign    Days of Exercise per Week: 3 days    Minutes of  Exercise per Session: 20 min  Stress: No Stress Concern Present (07/20/2023)   [employer] of Occupational Health - Occupational Stress Questionnaire    Feeling of Stress : Only a little  Social Connections: Moderately Integrated (07/20/2023)   Social Connection and Isolation Panel [NHANES]    Frequency of Communication with Friends and Family: More than three times a week    Frequency of Social Gatherings with Friends and Family: Once a week    Attends Religious Services: More than 4 times per year    Active Member of [employer] or Organizations: No    Attends Engineer, structural: Not on file    Marital Status: Living with partner    Family History  Problem Relation Age of Onset   Hypertension Mother    Sleep apnea Mother    High Cholesterol Mother    Diabetes Father    Pancreatic cancer Maternal Grandmother    Stroke Maternal Aunt     Review of Systems:  Review of Systems  Constitutional:  Negative for chills, fever and weight loss.  HENT:  Negative for tinnitus.   Respiratory:  Negative for cough, sputum production and shortness of breath.   Cardiovascular:  Negative for chest pain, palpitations and leg swelling.  Gastrointestinal:  Negative for abdominal pain, constipation, diarrhea and heartburn.  Genitourinary:  Negative for dysuria, frequency and urgency.  Musculoskeletal:  Negative for back pain, falls, joint pain and myalgias.  Skin: Negative.   Neurological:  Negative for dizziness and headaches.  Psychiatric/Behavioral:  Negative for depression and memory loss. The patient does not have insomnia.      Allergies as of 07/20/2023       Reactions   Sulfa Antibiotics Hives        Medication List        Accurate as of July 20, 2023  8:37 AM. If you have any questions, ask your nurse or doctor.          hydrochlorothiazide 25 MG tablet Commonly known as: HYDRODIURIL Take 1 tablet by mouth once daily   losartan 50 MG tablet Commonly  known as: COZAAR TAKE ONE AND ONE-HALF TABLETS BY MOUTH ONCE DAILY   Nexplanon 68 MG Impl implant Generic drug: etonogestrel 1 each by Subdermal route once. Left arm          Physical Exam: Vitals:   07/20/23 0813  BP: 118/82  Pulse: 86  Resp: 16  Temp: 98 F (36.7 C)  SpO2: 98%  Weight: 188 lb 12.8 oz (85.6 kg)  Height: 5\' 6"  (1.676 m)   Body mass index is 30.47 kg/m. Wt Readings from Last 3 Encounters:  07/20/23 188 lb 12.8 oz (85.6 kg)  09/30/22 184 lb 12.8 oz (83.8 kg)  06/09/22 187 lb 9.6 oz (85.1 kg)    Physical Exam Constitutional:      General: She is not in acute distress.    Appearance: She is well-developed. She is not diaphoretic.  HENT:     Head: Normocephalic and atraumatic.     Mouth/Throat:     Pharynx: No oropharyngeal exudate.  Eyes:     Conjunctiva/sclera: Conjunctivae normal.     Pupils: Pupils are equal, round, and reactive to  light.  Cardiovascular:     Rate and Rhythm: Normal rate and regular rhythm.     Heart sounds: Normal heart sounds.  Pulmonary:     Effort: Pulmonary effort is normal.     Breath sounds: Normal breath sounds.  Abdominal:     General: Bowel sounds are normal.     Palpations: Abdomen is soft.  Musculoskeletal:     Cervical back: Normal range of motion and neck supple.     Right lower leg: No edema.     Left lower leg: No edema.  Skin:    General: Skin is warm and dry.  Neurological:     Mental Status: She is alert.  Psychiatric:        Mood and Affect: Mood normal.     Labs reviewed: Basic Metabolic Panel: No results for input(s): "NA", "K", "CL", "CO2", "GLUCOSE", "BUN", "CREATININE", "CALCIUM", "MG", "PHOS", "TSH" in the last 8760 hours. Liver Function Tests: No results for input(s): "AST", "ALT", "ALKPHOS", "BILITOT", "PROT", "ALBUMIN" in the last 8760 hours. No results for input(s): "LIPASE", "AMYLASE" in the last 8760 hours. No results for input(s): "AMMONIA" in the last 8760 hours. CBC: No  results for input(s): "WBC", "NEUTROABS", "HGB", "HCT", "MCV", "PLT" in the last 8760 hours. Lipid Panel: No results for input(s): "CHOL", "HDL", "LDLCALC", "TRIG", "CHOLHDL", "LDLDIRECT" in the last 8760 hours. No results found for: "HGBA1C"  Procedures: No results found.  Assessment/Plan 1. Preventative health care wellness visit completed today, The patient was counseled regarding the appropriate use of alcohol, regular self-examination of the breasts on a monthly basis, prevention of dental and periodontal disease, diet, regular sustained exercise for at least 30 minutes 5 times per week, routine screening interval for mammogram as recommended by the American Cancer Society and ACOG, importance of regular PAP smears,  and recommended schedule for GI hemoccult testing, colonoscopy, cholesterol, thyroid and diabetes screening.  2. Need for influenza vaccination  - Flu vaccine trivalent PF, 6mos and older(Flulaval,Afluria,Fluarix,Fluzone)  3. Essential hypertension -Blood pressure well controlled, goal bp <140/90 Continue current medications and dietary modifications follow metabolic panel - COMPLETE METABOLIC PANEL WITH GFR - CBC with Differential/Platelet  4. OSA (obstructive sleep apnea) -continues on cpap  5. Class 1 obesity with serious comorbidity and body mass index (BMI) of 30.0 to 30.9 in adult, unspecified obesity type --education provided on healthy weight loss through increase in physical activity and proper nutrition   6. Hyperlipidemia, unspecified hyperlipidemia type -continue dietary modifications - Lipid panel   Next appt: yearly for CPE Travious Vanover K. [name]  [facility] [phone]

## 2023-07-21 LAB — CBC WITH DIFFERENTIAL/PLATELET
Absolute Lymphocytes: 2146 {cells}/uL (ref 850–3900)
Absolute Monocytes: 348 {cells}/uL (ref 200–950)
Basophils Absolute: 20 {cells}/uL (ref 0–200)
Basophils Relative: 0.4 %
Eosinophils Absolute: 59 {cells}/uL (ref 15–500)
Eosinophils Relative: 1.2 %
HCT: 39.6 % (ref 35.0–45.0)
Hemoglobin: 13.1 g/dL (ref 11.7–15.5)
MCH: 31.1 pg (ref 27.0–33.0)
MCHC: 33.1 g/dL (ref 32.0–36.0)
MCV: 94.1 fL (ref 80.0–100.0)
MPV: 10.3 fL (ref 7.5–12.5)
Monocytes Relative: 7.1 %
Neutro Abs: 2328 {cells}/uL (ref 1500–7800)
Neutrophils Relative %: 47.5 %
Platelets: 291 10*3/uL (ref 140–400)
RBC: 4.21 10*6/uL (ref 3.80–5.10)
RDW: 12.3 % (ref 11.0–15.0)
Total Lymphocyte: 43.8 %
WBC: 4.9 10*3/uL (ref 3.8–10.8)

## 2023-07-21 LAB — COMPLETE METABOLIC PANEL WITH GFR
AG Ratio: 1.7 (calc) (ref 1.0–2.5)
ALT: 17 U/L (ref 6–29)
AST: 14 U/L (ref 10–30)
Albumin: 4.7 g/dL (ref 3.6–5.1)
Alkaline phosphatase (APISO): 59 U/L (ref 31–125)
BUN: 10 mg/dL (ref 7–25)
CO2: 25 mmol/L (ref 20–32)
Calcium: 9.4 mg/dL (ref 8.6–10.2)
Chloride: 104 mmol/L (ref 98–110)
Creat: 0.73 mg/dL (ref 0.50–0.97)
Globulin: 2.8 g/dL (ref 1.9–3.7)
Glucose, Bld: 85 mg/dL (ref 65–99)
Potassium: 4.1 mmol/L (ref 3.5–5.3)
Sodium: 139 mmol/L (ref 135–146)
Total Bilirubin: 0.4 mg/dL (ref 0.2–1.2)
Total Protein: 7.5 g/dL (ref 6.1–8.1)
eGFR: 111 mL/min/{1.73_m2} (ref >=60)

## 2023-07-21 LAB — LIPID PANEL
Cholesterol: 195 mg/dL (ref <200)
HDL: 43 mg/dL — ABNORMAL LOW (ref >=50)
LDL Cholesterol (Calc): 136 mg/dL — ABNORMAL HIGH
Non-HDL Cholesterol (Calc): 152 mg/dL — ABNORMAL HIGH (ref <130)
Total CHOL/HDL Ratio: 4.5 (calc) (ref <5.0)
Triglycerides: 69 mg/dL (ref <150)

## 2023-08-15 ENCOUNTER — Other Ambulatory Visit: Payer: Self-pay | Admitting: Nurse Practitioner

## 2023-08-15 DIAGNOSIS — I10 Essential (primary) hypertension: Secondary | ICD-10-CM

## 2023-08-17 NOTE — Telephone Encounter (Signed)
Patient medication has High Risk Warnings. Medication pend and sent to PCP Janyth Contes Janene Harvey, NP for approval.

## 2023-10-01 ENCOUNTER — Ambulatory Visit: Payer: 59 | Admitting: Adult Health

## 2023-10-28 ENCOUNTER — Encounter: Payer: Self-pay | Admitting: Orthopedic Surgery

## 2023-10-28 ENCOUNTER — Ambulatory Visit: Payer: Self-pay | Admitting: Nurse Practitioner

## 2023-10-28 ENCOUNTER — Ambulatory Visit (INDEPENDENT_AMBULATORY_CARE_PROVIDER_SITE_OTHER): Admitting: Orthopedic Surgery

## 2023-10-28 VITALS — BP 132/88 | HR 97 | Temp 97.8°F | Resp 20 | Ht 66.0 in | Wt 191.4 lb

## 2023-10-28 DIAGNOSIS — R5383 Other fatigue: Secondary | ICD-10-CM | POA: Diagnosis not present

## 2023-10-28 DIAGNOSIS — R519 Headache, unspecified: Secondary | ICD-10-CM

## 2023-10-28 DIAGNOSIS — M545 Low back pain, unspecified: Secondary | ICD-10-CM | POA: Diagnosis not present

## 2023-10-28 NOTE — Telephone Encounter (Signed)
  Chief Complaint: lower back pain Symptoms: fatigue, headache, lower back pain radiates to bilateral thighs Frequency: fatigue and headache intermittent x couple of days, lower back pain constant x today Pertinent Negatives: Patient denies cough, fever, weakness, loss of bowel or bladder control, abdominal pain. Disposition: [] ED /[] Urgent Care (no appt availability in office) / [x] Appointment(In office/virtual)/ []  [facility]/ [] Home Care/ [] Refused Recommended Disposition /[] [facility]/ []  Follow-up with PCP Additional Notes: Patient states she has been applying heat but has not taken any OTC medications for pain. Unable to access schedule due to technical error when accessing Book It, warm transferred patient to CALL staff member, Clydie Braun.  Copied from CRM [id number]. Topic: Clinical - Red Word Triage >> Oct 28, 2023  1:11 PM Dominique A wrote: [facility] that prompted transfer to Nurse Triage: Patient having extreme lower back pain and a headache. Reason for Disposition  [1] Pain radiates into the thigh or further down the leg AND [2] both legs  Answer Assessment - Initial Assessment Questions 1. ONSET: "When did the pain begin?"      Today.  2. LOCATION: "Where does it hurt?" (upper, mid or lower back)     Lower back.  3. SEVERITY: "How bad is the pain?"  (e.g., Scale 1-10; mild, moderate, or severe)   - MILD (1-3): Doesn't interfere with normal activities.    - MODERATE (4-7): Interferes with normal activities or awakens from sleep.    - SEVERE (8-10): Excruciating pain, unable to do any normal activities.      7/10, aching.  4. PATTERN: "Is the pain constant?" (e.g., yes, no; constant, intermittent)      Constant since this morning.  5. RADIATION: "Does the pain shoot into your legs or somewhere else?"     She states it shoots to both thighs.  6. CAUSE:  "What do you think is causing the back pain?"      She states she works from home and sits a lot during  the day.  7. BACK OVERUSE:  "Any recent lifting of heavy objects, strenuous work or exercise?"     Denies.  8. MEDICINES: "What have you taken so far for the pain?" (e.g., nothing, acetaminophen, NSAIDS)     Applied heat and denies any OTC or anything for pain.  9. NEUROLOGIC SYMPTOMS: "Do you have any weakness, numbness, or problems with bowel/bladder control?"     Numbness in left hand, comes and goes.  10. OTHER SYMPTOMS: "Do you have any other symptoms?" (e.g., fever, abdomen pain, burning with urination, blood in urine)       For last couple of days, intermittent fatigue. Headache x couple of days.  11. PREGNANCY: "Is there any chance you are pregnant?" "When was your last menstrual period?"       LMP 10/17/23  Protocols used: Back Pain-A-AH

## 2023-10-28 NOTE — Patient Instructions (Signed)
 Continue tylenol 1000 mg > can take up to 3 times daily  Try Zyrtec 10 mg once daily

## 2023-10-28 NOTE — Telephone Encounter (Signed)
 Patient is in the office for an appointment to see Hazle Nordmann, NP

## 2023-10-28 NOTE — Progress Notes (Signed)
 Careteam: Patient Care Team: Sharon Seller, NP as PCP - General (Geriatric Medicine) Darnell Level, MD as Consulting Physician (General Surgery) Philip Aspen, DO as Consulting Physician (Obstetrics and Gynecology)  Seen by: Hazle Nordmann, AGNP-C  PLACE OF SERVICE:  [facility]  Advanced Directive information Does Patient Have a Medical Advance Directive?: No, Would patient like information on creating a medical advance directive?: No - Patient declined  Allergies  Allergen Reactions   Sulfa Antibiotics Hives    Chief Complaint  Patient presents with   Acute Visit    Headache lower back pain .     HPI: Patient is a 33 y.o. female seen today for acute visit due to headache and back pain.   Discussed the use of AI scribe software for clinical note transcription with the patient, who gave verbal consent to proceed.  She has been experiencing headaches for the past couple of days. The headaches are located at the front of her head, right in front of her eyes, and have been intermittent. Admits to looking at computer screen most of the day for work. H/o HTN, today's blood pressure 132/88.  No nasal congestion, sneezing, or other cold-like symptoms. She took 2 extra strength Tylenol before office visit. No relief at this time. Admits to drinking 4-5 bottles of water daily. Does not smoke. Does not drink alcohol. Denies drug use.   Today, she experienced new onset back pain, described as stabbing and radiating down her legs. This pain began today and was not alleviated by Tylenol. No recent heavy lifting or unusual activities that could have triggered the pain. She has two children but states that picking them up is not unusual for her. No loss of bowel or bladder function.   H/o OSA. She uses CPAP every day. Increased fatigue began earlier this week. Denies cold symptoms, blood loss, poor sleep.          Review of Systems:  Review of Systems  Constitutional:  Positive for  malaise/fatigue. Negative for fever.  HENT:  Negative for congestion and sore throat.   Eyes:  Negative for blurred vision.  Respiratory:  Negative for cough and shortness of breath.   Cardiovascular:  Negative for chest pain.  Gastrointestinal:  Negative for blood in stool.  Genitourinary:  Negative for hematuria.  Musculoskeletal:  Positive for back pain. Negative for falls.  Neurological:  Positive for headaches. Negative for dizziness.  Psychiatric/Behavioral:  Negative for depression. The patient is not nervous/anxious.     Past Medical History:  Diagnosis Date   Cholelithiasis 03/2017   Chronic cholecystitis 03/2017   Hypertension    states under control with med., has been on med. since age 47   Past Surgical History:  Procedure Laterality Date   CHOLECYSTECTOMY     CHOLECYSTECTOMY N/A 04/16/2017   Procedure: LAPAROSCOPIC CHOLECYSTECTOMY WITH INTRAOPERATIVE CHOLANGIOGRAM;  Surgeon: Darnell Level, MD;  Location: [facility];  Service: General;  Laterality: N/A;   DERMOID CYST  EXCISION Left 12/19/2003   post-auricular   ERCP N/A 04/17/2017   Procedure: ENDOSCOPIC RETROGRADE CHOLANGIOPANCREATOGRAPHY (ERCP);  Surgeon: Vida Rigger, MD;  Location: [facility] ENDOSCOPY;  Service: Endoscopy;  Laterality: N/A;   WISDOM TOOTH EXTRACTION  2008   Social History:   reports that she has never smoked. She has been exposed to tobacco smoke. She has never used smokeless tobacco. She reports current alcohol use. She reports that she does not use drugs.  Family History  Problem Relation Age of Onset  Hypertension Mother    Sleep apnea Mother    High Cholesterol Mother    Diabetes Father    Pancreatic cancer Maternal Grandmother    Stroke Maternal Aunt     Medications: Patient's Medications  New Prescriptions   No medications on file  Previous Medications   ETONOGESTREL (NEXPLANON) 68 MG IMPL IMPLANT    1 each by Subdermal route once. Left arm   HYDROCHLOROTHIAZIDE  (HYDRODIURIL) 25 MG TABLET    Take 1 tablet by mouth once daily   LOSARTAN (COZAAR) 50 MG TABLET    TAKE ONE AND ONE-HALF TABLETS BY MOUTH ONCE DAILY  Modified Medications   No medications on file  Discontinued Medications   No medications on file    Physical Exam:  Vitals:   10/28/23 1414  BP: 132/88  Pulse: 97  Resp: 20  Temp: 97.8 F (36.6 C)  SpO2: 99%  Weight: 191 lb 6.4 oz (86.8 kg)  Height: 5\' 6"  (1.676 m)   Body mass index is 30.89 kg/m. Wt Readings from Last 3 Encounters:  10/28/23 191 lb 6.4 oz (86.8 kg)  07/20/23 188 lb 12.8 oz (85.6 kg)  09/30/22 184 lb 12.8 oz (83.8 kg)    Physical Exam Vitals reviewed.  Constitutional:      General: She is not in acute distress. HENT:     Head: Normocephalic.  Eyes:     General:        Right eye: No discharge.        Left eye: No discharge.  Neck:     Thyroid: No thyroid mass or thyromegaly.  Cardiovascular:     Rate and Rhythm: Normal rate and regular rhythm.     Pulses: Normal pulses.     Heart sounds: Normal heart sounds.  Pulmonary:     Effort: Pulmonary effort is normal.     Breath sounds: Normal breath sounds.  Abdominal:     General: Bowel sounds are normal.     Palpations: Abdomen is soft.  Musculoskeletal:        General: Normal range of motion.     Cervical back: Normal and neck supple.     Thoracic back: Normal.     Lumbar back: Normal.  Lymphadenopathy:     Cervical: No cervical adenopathy.  Skin:    General: Skin is warm.     Capillary Refill: Capillary refill takes less than 2 seconds.  Neurological:     General: No focal deficit present.     Mental Status: She is alert and oriented to person, place, and time.  Psychiatric:        Mood and Affect: Mood normal.     Labs reviewed: Basic Metabolic Panel: Recent Labs    07/20/23 0853  NA 139  K 4.1  CL 104  CO2 25  GLUCOSE 85  BUN 10  CREATININE 0.73  CALCIUM 9.4   Liver Function Tests: Recent Labs    07/20/23 0853  AST 14   ALT 17  BILITOT 0.4  PROT 7.5   No results for input(s): "LIPASE", "AMYLASE" in the last 8760 hours. No results for input(s): "AMMONIA" in the last 8760 hours. CBC: Recent Labs    07/20/23 0853  WBC 4.9  NEUTROABS 2,328  HGB 13.1  HCT 39.6  MCV 94.1  PLT 291   Lipid Panel: Recent Labs    07/20/23 0853  CHOL 195  HDL 43*  LDLCALC 136*  TRIG 69  CHOLHDL 4.5   TSH: No results  for input(s): "TSH" in the last 8760 hours. A1C: No results found for: "HGBA1C"   Assessment/Plan 1. Other fatigue (Primary) - onset x 4 days - denies blood loss or cold - cbc/diff to r/o infection, blood loss, anemia - CBC with Differential/Platelet - Basic Metabolic Panel with eGFR  2. Acute low back pain without sciatica, unspecified back pain laterality - onset x 1 day - no recent heavy lifting - exam unremarkable - encourage light walking - avoid bending, lifting, twisting mechanics - cont tylenol 1000 mg TID prn for pain - if no improvement in 4 weeks> xray  3. Acute nonintractable headache, unspecified headache type - onset x 4 days - unclear if tylenol effective> took prior to encounter - cont tylenol 1000 mg po TID prn - discussed trying Zyrtec 10 mg to see if any relief - encourage hydration with water - recommend hot shower or heating pad - Basic Metabolic Panel with eGFR  Total time: 22 minutes. Greater than 50% of total time spent doing patient education regarding headaches, low back pain and fatigue including symptom/medication management.    Next appt: Cassy Sprowl Norval Gable, NP  [facility]  [facility] [phone]

## 2023-10-29 ENCOUNTER — Encounter: Payer: Self-pay | Admitting: Orthopedic Surgery

## 2023-10-29 LAB — BASIC METABOLIC PANEL WITH GFR
BUN: 12 mg/dL (ref 7–25)
CO2: 28 mmol/L (ref 20–32)
Calcium: 9.3 mg/dL (ref 8.6–10.2)
Chloride: 104 mmol/L (ref 98–110)
Creat: 0.84 mg/dL (ref 0.50–0.97)
Glucose, Bld: 94 mg/dL (ref 65–139)
Potassium: 3.8 mmol/L (ref 3.5–5.3)
Sodium: 139 mmol/L (ref 135–146)
eGFR: 93 mL/min/{1.73_m2} (ref >=60)

## 2023-10-29 LAB — CBC WITH DIFFERENTIAL/PLATELET
Absolute Lymphocytes: 2237 {cells}/uL (ref 850–3900)
Absolute Monocytes: 573 {cells}/uL (ref 200–950)
Basophils Absolute: 19 {cells}/uL (ref 0–200)
Basophils Relative: 0.3 %
Eosinophils Absolute: 38 {cells}/uL (ref 15–500)
Eosinophils Relative: 0.6 %
HCT: 39.1 % (ref 35.0–45.0)
Hemoglobin: 13.4 g/dL (ref 11.7–15.5)
MCH: 31.6 pg (ref 27.0–33.0)
MCHC: 34.3 g/dL (ref 32.0–36.0)
MCV: 92.2 fL (ref 80.0–100.0)
MPV: 10.2 fL (ref 7.5–12.5)
Monocytes Relative: 9.1 %
Neutro Abs: 3434 {cells}/uL (ref 1500–7800)
Neutrophils Relative %: 54.5 %
Platelets: 309 10*3/uL (ref 140–400)
RBC: 4.24 10*6/uL (ref 3.80–5.10)
RDW: 12.4 % (ref 11.0–15.0)
Total Lymphocyte: 35.5 %
WBC: 6.3 10*3/uL (ref 3.8–10.8)

## 2023-11-11 ENCOUNTER — Other Ambulatory Visit: Payer: Self-pay | Admitting: Nurse Practitioner

## 2023-11-11 DIAGNOSIS — I10 Essential (primary) hypertension: Secondary | ICD-10-CM

## 2023-11-11 NOTE — Telephone Encounter (Signed)
 High risk warning populated when attempting to refill medication  Please review and approval if appropriate   Thanks,  [facility] W/CMA

## 2023-11-23 ENCOUNTER — Ambulatory Visit: Payer: PRIVATE HEALTH INSURANCE | Admitting: Adult Health

## 2023-11-23 ENCOUNTER — Encounter: Payer: Self-pay | Admitting: Adult Health

## 2023-11-23 VITALS — BP 126/78 | HR 78 | Ht 66.0 in | Wt 191.6 lb

## 2023-11-23 DIAGNOSIS — Z6830 Body mass index (BMI) 30.0-30.9, adult: Secondary | ICD-10-CM | POA: Diagnosis not present

## 2023-11-23 DIAGNOSIS — E66811 Obesity, class 1: Secondary | ICD-10-CM | POA: Diagnosis not present

## 2023-11-23 DIAGNOSIS — G4733 Obstructive sleep apnea (adult) (pediatric): Secondary | ICD-10-CM

## 2023-11-23 NOTE — Assessment & Plan Note (Addendum)
 Healthy weight loss discussed Discussed Zepbound as a weight loss option and sleep apnea

## 2023-11-23 NOTE — Assessment & Plan Note (Signed)
 Excellent control and compliance on CPAP with perceived benefit.  May like the DreamWear nasal mask-this might be more comfortable for her.  Patient education given on alternative treatments for sleep apnea including weight loss, oral appliance. Patient is going to work on weight loss if it is successful consider repeating her sleep study to see if oral device would be an option for her.  Plan  Patient Instructions  Continue on CPAP at bedtime. 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  Do not drive if sleepy  May discuss with Primary Provider -Zepbound for weight loss with sleep apnea.  May like Dreamwear Nasal mask.  Follow up in 1 year for year and As needed

## 2023-11-23 NOTE — Patient Instructions (Addendum)
 Continue on CPAP at bedtime. 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  Do not drive if sleepy  May discuss with Primary Provider -Zepbound for weight loss with sleep apnea.  May like Dreamwear Nasal mask.  Follow up in 1 year for year and As needed

## 2023-11-23 NOTE — Progress Notes (Signed)
 @Patient  ID: Kristi Richmond, female    DOB: 11/08/1990, 33 y.o.   MRN: [id number]  Chief Complaint  Patient presents with   Follow-up    Referring provider: Sharon Seller, NP  HPI: 33 year old female followed for severe sleep apnea Sleep consult 11/2021 with restless sleep found to have severe OSA on HST   TEST/EVENTS :  home sleep study on Dec 27, 2021 that showed severe sleep apnea with AHI at 52.9/hour and SPO2 low at 82%.   11/23/2023 Follow up ; OSA  Patient presents for a 1 year follow-up.  She has underlying severe sleep apnea is on CPAP therapy.  Patient says she is doing well on CPAP.  She does have decreased daytime sleepiness.  She feels she is benefiting from CPAP.  She also feels that her blood pressure has been much better controlled since starting CPAP.  CPAP download shows 100 % compliance with daily average usage at 7 hours.  She is on auto CPAP 5 to 15 cm H2O.  AHI 0.3.  Daily average pressure 10.6 cm H2O.  She is using a N30 nasal mask.  She uses [employer] for her DME.  Patient says that she feels that the mask is aggravating her and causing her to wake up several times throughout the night.  She says that sometimes she feels that her sleep is fragmented due to this.  Currently has the N30 nasal mask which has the tubing in the front that seems to move around when she sleeping.  We did discuss alternative mask.  We also discussed in detail weight loss strategies.  Patient education given on inspire device.  Allergies  Allergen Reactions   Sulfa Antibiotics Hives    Immunization History  Administered Date(s) Administered   DTaP 09/03/1989, 11/02/1989, 01/13/1990, 06/06/1994   HIB (PRP-OMP) 09/03/1989, 11/02/1989, 01/13/1990   HPV Quadrivalent 05/04/2007, 03/15/2008, 07/21/2011   Hepatitis B 06/04/2001, 07/09/2001, 12/03/2001   Influenza Inj Mdck Quad With Preservative 05/26/2019   Influenza, Quadrivalent, Recombinant, Inj, Pf 05/23/2016   Influenza, Seasonal,  Injecte, Preservative Fre 05/28/2015, 07/20/2023   Influenza,inj,Quad PF,6+ Mos 05/28/2015, 06/20/2018, 05/24/2021, 06/03/2021, 06/09/2022   Influenza-Unspecified 05/28/2015, 05/26/2017, 06/20/2018   MMR 10/04/1990, 06/06/1994   Meningococcal Conjugate 07/01/2004   OPV 09/03/1989, 11/02/1989, 01/13/1990, 01/04/1991   PFIZER(Purple Top)SARS-COV-2 Vaccination 04/27/2020, 05/18/2020   PPD Test 03/14/2014   Tdap 11/08/2003, 09/05/2009, 09/19/2016    Past Medical History:  Diagnosis Date   Cholelithiasis 03/2017   Chronic cholecystitis 03/2017   Hypertension    states under control with med., has been on med. since age 89    Tobacco History: Social History   Tobacco Use  Smoking Status Never   Passive exposure: Past  Smokeless Tobacco Never   Counseling given: Not Answered   Outpatient Medications Prior to Visit  Medication Sig Dispense Refill   etonogestrel (NEXPLANON) 68 MG IMPL implant 1 each by Subdermal route once. Left arm     hydrochlorothiazide (HYDRODIURIL) 25 MG tablet Take 1 tablet by mouth once daily 90 tablet 1   losartan (COZAAR) 50 MG tablet TAKE 1 & 1/2 (ONE & ONE-HALF) TABLETS BY MOUTH ONCE DAILY 135 tablet 0   No facility-administered medications prior to visit.     Review of Systems:   Constitutional:   No  weight loss, night sweats,  Fevers, chills, fatigue, or  lassitude.  HEENT:   No headaches,  Difficulty swallowing,  Tooth/dental problems, or  Sore throat,  No sneezing, itching, ear ache, nasal congestion, post nasal drip,   CV:  No chest pain,  Orthopnea, PND, swelling in lower extremities, anasarca, dizziness, palpitations, syncope.   GI  No heartburn, indigestion, abdominal pain, nausea, vomiting, diarrhea, change in bowel habits, loss of appetite, bloody stools.   Resp: No shortness of breath with exertion or at rest.  No excess mucus, no productive cough,  No non-productive cough,  No coughing up of blood.  No change in color of  mucus.  No wheezing.  No chest wall deformity  Skin: no rash or lesions.  GU: no dysuria, change in color of urine, no urgency or frequency.  No flank pain, no hematuria   MS:  No joint pain or swelling.  No decreased range of motion.  No back pain.    Physical Exam  BP 126/78 (BP Location: Left Arm, Patient Position: Sitting, Cuff Size: Large)   Pulse 78   Ht 5\' 6"  (1.676 m)   Wt 191 lb 9.6 oz (86.9 kg)   SpO2 100%   BMI 30.93 kg/m   GEN: A/Ox3; pleasant , NAD, well nourished    HEENT:  [place]/AT,  NOSE-clear, THROAT-clear, no lesions, no postnasal drip or exudate noted.   NECK:  Supple w/ fair ROM; no JVD; normal carotid impulses w/o bruits; no thyromegaly or nodules palpated; no lymphadenopathy.    RESP  Clear  P & A; w/o, wheezes/ rales/ or rhonchi. no accessory muscle use, no dullness to percussion  CARD:  RRR, no m/r/g, no peripheral edema, pulses intact, no cyanosis or clubbing.  GI:   Soft & nt; nml bowel sounds; no organomegaly or masses detected.   Musco: Warm bil, no deformities or joint swelling noted.   Neuro: alert, no focal deficits noted.    Skin: Warm, no lesions or rashes    Lab Results:  CBC   ProBNP No results found for: "PROBNP"  Imaging: No results found.  Administration History     None           No data to display          No results found for: "NITRICOXIDE"      Assessment & Plan:   OSA (obstructive sleep apnea) Excellent control and compliance on CPAP with perceived benefit.  May like the DreamWear nasal mask-this might be more comfortable for her.  Patient education given on alternative treatments for sleep apnea including weight loss, oral appliance. Patient is going to work on weight loss if it is successful consider repeating her sleep study to see if oral device would be an option for her.  Plan  Patient Instructions  Continue on CPAP at bedtime. 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  Do not drive if sleepy  May discuss with Primary Provider -Zepbound for weight loss with sleep apnea.  May like Dreamwear Nasal mask.  Follow up in 1 year for year and As needed      Obesity Healthy weight loss discussed Discussed Zepbound as a weight loss option and sleep apnea     Rubye Oaks, NP 11/23/2023

## 2024-04-17 ENCOUNTER — Other Ambulatory Visit: Payer: Self-pay | Admitting: Nurse Practitioner

## 2024-04-17 DIAGNOSIS — I10 Essential (primary) hypertension: Secondary | ICD-10-CM

## 2024-04-18 NOTE — Telephone Encounter (Signed)
 High risk warning

## 2024-07-06 ENCOUNTER — Telehealth: Payer: PRIVATE HEALTH INSURANCE | Admitting: Physician Assistant

## 2024-07-06 DIAGNOSIS — N760 Acute vaginitis: Secondary | ICD-10-CM | POA: Diagnosis not present

## 2024-07-06 DIAGNOSIS — B9689 Other specified bacterial agents as the cause of diseases classified elsewhere: Secondary | ICD-10-CM

## 2024-07-06 MED ORDER — METRONIDAZOLE 500 MG PO TABS: 500.0000 mg | ORAL_TABLET | Freq: Two times a day (BID) | ORAL | 0 refills | Status: DC

## 2024-07-06 NOTE — Progress Notes (Signed)

## 2024-07-25 ENCOUNTER — Encounter: Payer: No Typology Code available for payment source | Admitting: Nurse Practitioner

## 2024-08-05 ENCOUNTER — Encounter: Payer: Self-pay | Admitting: Nurse Practitioner

## 2024-08-05 ENCOUNTER — Ambulatory Visit: Payer: PRIVATE HEALTH INSURANCE | Admitting: Nurse Practitioner

## 2024-08-05 VITALS — BP 130/80 | HR 78 | Temp 97.4°F | Resp 18 | Ht 66.0 in | Wt 191.8 lb

## 2024-08-05 DIAGNOSIS — G4733 Obstructive sleep apnea (adult) (pediatric): Secondary | ICD-10-CM

## 2024-08-05 DIAGNOSIS — Z0000 Encounter for general adult medical examination without abnormal findings: Secondary | ICD-10-CM | POA: Diagnosis not present

## 2024-08-05 DIAGNOSIS — I10 Essential (primary) hypertension: Secondary | ICD-10-CM

## 2024-08-05 DIAGNOSIS — Z6830 Body mass index (BMI) 30.0-30.9, adult: Secondary | ICD-10-CM | POA: Diagnosis not present

## 2024-08-05 DIAGNOSIS — E785 Hyperlipidemia, unspecified: Secondary | ICD-10-CM

## 2024-08-05 DIAGNOSIS — E66811 Obesity, class 1: Secondary | ICD-10-CM

## 2024-08-05 MED ORDER — ZEPBOUND 2.5 MG/0.5ML ~~LOC~~ SOAJ: 2.5000 mg | SUBCUTANEOUS | 1 refills | Status: AC

## 2024-08-05 NOTE — Progress Notes (Signed)
 Provider: Caro Harlene POUR, NP  Patient Care Team: Caro Harlene POUR, NP as PCP - General (Geriatric Medicine) Eletha Boas, MD as Consulting Physician (General Surgery) Lilton Legions, DO as Consulting Physician (Obstetrics and Gynecology)  Extended Emergency Contact Information Primary Emergency Contact: [facility] Address: [address]          [place] [address], [place] [address] [place] Home Phone: [phone] Relation: Mother Secondary Emergency Contact: Mitchell,Ricardo Address: [address]          [place], [place] [address] [place] Mobile Phone: [phone] Relation: Significant other Allergies[1] Code Status: FULL Goals of Care: Advanced Directive information    10/28/2023    2:13 PM  Advanced Directives  Does Patient Have a Medical Advance Directive? No  Would patient like information on creating a medical advance directive? No - Patient declined     Chief Complaint  Patient presents with   Annual Exam    CPE    HPI: Patient is a 33 y.o. female seen in today for an wellness exam at [facility] Discussed the use of AI scribe software for clinical note transcription with the patient, who gave verbal consent to proceed.  History of Present Illness Kristi Richmond is a 33 year old female who presents for an annual physical exam.  She experiences prolonged menstrual periods lasting fourteen days, described as heavy. She has taken ibuprofen  for symptom relief. A pregnancy test was negative.  She does not take aspirin or other medications that could affect bleeding.  She has sleep apnea and uses a CPAP machine, which she finds difficult to tolerate. She is interested in weight loss as a potential way to improve her sleep apnea symptoms. There is a family history of sleep apnea, including her aunt, mother, and brother.  She has noticed a mole on her skin that has become darker and larger since the beginning of the year. Her daughter recently  brought it to her attention again.  She reports loose stools since her gallbladder removal, which affects her ability to eat out due to unpredictable diarrhea.  She uses readers for computer work. No family history of breast cancer.       08/05/2024    9:10 AM 10/28/2023    2:13 PM 12/06/2021    8:08 AM 06/03/2021    8:57 AM 12/03/2020    9:25 AM  Depression screen PHQ 2/9  Decreased Interest 0 0 0 0 0  Down, Depressed, Hopeless 0 0 0 0 0  PHQ - 2 Score 0 0 0 0 0       12/06/2021    8:08 AM 07/20/2023    8:18 AM 10/28/2023    2:13 PM 11/23/2023    8:27 AM 08/05/2024    9:10 AM  Fall Risk  Falls in the past year? 0 0 0 0 0  Was there an injury with Fall? 0  0  0   0  Fall Risk Category Calculator 0 0 0  0  Fall Risk Category (Retired) Low       (RETIRED) Patient Fall Risk Level Low fall risk       Patient at Risk for Falls Due to No Fall Risks No Fall Risks   No Fall Risks  Fall risk Follow up Falls evaluation completed  Falls evaluation completed;Education provided;Falls prevention discussed   Falls evaluation completed     Data saved with a previous flowsheet row definition       No data to  display           Health Maintenance  Topic Date Due   Influenza Vaccine  11/22/2024 (Originally 03/25/2024)   COVID-19 Vaccine (3 - 2025-26 season) 07/25/2025 (Originally 04/25/2024)   Cervical Cancer Screening (HPV/Pap Cotest)  11/18/2025   DTaP/Tdap/Td (8 - Td or Tdap) 09/19/2026   Hepatitis B Vaccines 19-59 Average Risk  Completed   HPV VACCINES  Completed   Hepatitis C Screening  Completed   HIV Screening  Completed   Pneumococcal Vaccine  Aged Out   Meningococcal B Vaccine  Aged Out    Past Medical History:  Diagnosis Date   Cholelithiasis 03/2017   Chronic cholecystitis 03/2017   Hypertension    states under control with med., has been on med. since age 73    Past Surgical History:  Procedure Laterality Date   CHOLECYSTECTOMY     CHOLECYSTECTOMY N/A 04/16/2017    Procedure: LAPAROSCOPIC CHOLECYSTECTOMY WITH INTRAOPERATIVE CHOLANGIOGRAM;  Surgeon: Eletha Boas, MD;  Location: [facility];  Service: General;  Laterality: N/A;   DERMOID CYST  EXCISION Left 12/19/2003   post-auricular   ERCP N/A 04/17/2017   Procedure: ENDOSCOPIC RETROGRADE CHOLANGIOPANCREATOGRAPHY (ERCP);  Surgeon: Rosalie Kitchens, MD;  Location: [facility];  Service: Endoscopy;  Laterality: N/A;   WISDOM TOOTH EXTRACTION  2008    Social History   Socioeconomic History   Marital status: Single    Spouse name: Not on file   Number of children: Not on file   Years of education: Not on file   Highest education level: Associate degree: academic program  Occupational History   Not on file  Tobacco Use   Smoking status: Never    Passive exposure: Past   Smokeless tobacco: Never  Vaping Use   Vaping status: Never Used  Substance and Sexual Activity   Alcohol use: Yes    Comment: occasionally    Drug use: No   Sexual activity: Yes    Birth control/protection: Implant  Other Topics Concern   Not on file  Social History Narrative   Tobacco use, amount per day now: N/A   Past tobacco use, amount per day: N/A   How many years did you use tobacco: N/A   Alcohol use (drinks per week): 1 glass of wine.   Diet:   Do you drink/eat things with caffeine: Yes.   Marital status: Single.                                What year were you married?   Do you live in a house, apartment, assisted living, condo, trailer, etc.? House.   Is it one or more stories? 1   How many persons live in your home? 4   Do you have pets in your home?( please list) No.   Highest Level of education completed: Associate.   Current or past profession:   Do you exercise?  No.                                   Type and how often? No.   Do you have a living will? No.   Do you have a DNR form?  No.  If not, do you want to discuss one?   Do you have signed POA/HPOA forms?  No.                       If so, please bring to you appointment    Do you have any difficulty bathing or dressing yourself? No.   Do you have difficulty preparing food or eating? No.   Do you have difficulty managing your medications? No.   Do you have any difficulty managing your finances? No.   Do you have any difficulty affording your medications? No.       Social Drivers of Health   Tobacco Use: Low Risk (08/05/2024)   Patient History    Smoking Tobacco Use: Never    Smokeless Tobacco Use: Never    Passive Exposure: Past  Financial Resource Strain: Low Risk (08/02/2024)   Overall Financial Resource Strain (CARDIA)    Difficulty of Paying Living Expenses: Not hard at all  Food Insecurity: No Food Insecurity (08/02/2024)   Epic    Worried About Programme Researcher, Broadcasting/film/video in the Last Year: Never true    Ran Out of Food in the Last Year: Never true  Transportation Needs: No Transportation Needs (08/02/2024)   Epic    Lack of Transportation (Medical): No    Lack of Transportation (Non-Medical): No  Physical Activity: Insufficiently Active (08/02/2024)   Exercise Vital Sign    Days of Exercise per Week: 1 day    Minutes of Exercise per Session: 20 min  Stress: No Stress Concern Present (08/02/2024)   [employer] of Occupational Health - Occupational Stress Questionnaire    Feeling of Stress: Only a little  Social Connections: Socially Integrated (08/02/2024)   Social Connection and Isolation Panel    Frequency of Communication with Friends and Family: More than three times a week    Frequency of Social Gatherings with Friends and Family: Twice a week    Attends Religious Services: 1 to 4 times per year    Active Member of Clubs or Organizations: Yes    Attends Banker Meetings: More than 4 times per year    Marital Status: Living with partner  Depression (PHQ2-9): Low Risk (08/05/2024)   Depression (PHQ2-9)    PHQ-2 Score: 0  Alcohol Screen: Low Risk (07/20/2023)    Alcohol Screen    Last Alcohol Screening Score (AUDIT): 1  Housing: Unknown (08/02/2024)   Epic    Unable to Pay for Housing in the Last Year: No    Number of Times Moved in the Last Year: Not on file    Homeless in the Last Year: No  Utilities: Not on file  Health Literacy: Not on file    Family History  Problem Relation Age of Onset   Hypertension Mother    Sleep apnea Mother    High Cholesterol Mother    Diabetes Father    Pancreatic cancer Maternal Grandmother    Stroke Maternal Aunt     Review of Systems:  Review of Systems  Constitutional:  Negative for chills, fever and weight loss.  HENT:  Negative for tinnitus.   Respiratory:  Negative for cough, sputum production and shortness of breath.   Cardiovascular:  Negative for chest pain, palpitations and leg swelling.  Gastrointestinal:  Negative for abdominal pain, constipation, diarrhea and heartburn.  Genitourinary:  Negative for dysuria, frequency and urgency.  Musculoskeletal:  Negative for back pain, falls, joint pain and myalgias.  Skin:  Negative.   Neurological:  Negative for dizziness and headaches.  Psychiatric/Behavioral:  Negative for depression and memory loss. The patient does not have insomnia.      Allergies as of 08/05/2024       Reactions   Sulfa Antibiotics Hives        Medication List        Accurate as of August 05, 2024  9:18 AM. If you have any questions, ask your nurse or doctor.          hydrochlorothiazide  25 MG tablet Commonly known as: HYDRODIURIL  Take 1 tablet by mouth once daily   losartan  50 MG tablet Commonly known as: COZAAR  TAKE 1 & 1/2 (ONE & ONE-HALF) TABLETS BY MOUTH ONCE DAILY   metroNIDAZOLE  500 MG tablet Commonly known as: FLAGYL  Take 1 tablet (500 mg total) by mouth 2 (two) times daily.   Nexplanon  68 MG Impl implant Generic drug: etonogestrel  1 each by Subdermal route once. Left arm          Physical Exam: Vitals:   08/05/24 0908  BP: 130/80   Pulse: 78  Resp: 18  Temp: (!) 97.4 F (36.3 C)  SpO2: 98%  Weight: 191 lb 12.8 oz (87 kg)  Height: 5' 6 (1.676 m)   Body mass index is 30.96 kg/m. Wt Readings from Last 3 Encounters:  08/05/24 191 lb 12.8 oz (87 kg)  11/23/23 191 lb 9.6 oz (86.9 kg)  10/28/23 191 lb 6.4 oz (86.8 kg)    Physical Exam Constitutional:      General: She is not in acute distress.    Appearance: She is well-developed. She is not diaphoretic.  HENT:     Head: Normocephalic and atraumatic.     Mouth/Throat:     Pharynx: No oropharyngeal exudate.  Eyes:     Conjunctiva/sclera: Conjunctivae normal.     Pupils: Pupils are equal, round, and reactive to light.  Cardiovascular:     Rate and Rhythm: Normal rate and regular rhythm.     Heart sounds: Normal heart sounds.  Pulmonary:     Effort: Pulmonary effort is normal.     Breath sounds: Normal breath sounds.  Abdominal:     General: Bowel sounds are normal.     Palpations: Abdomen is soft.  Musculoskeletal:     Cervical back: Normal range of motion and neck supple.     Right lower leg: No edema.     Left lower leg: No edema.  Skin:    General: Skin is warm and dry.  Neurological:     Mental Status: She is alert.  Psychiatric:        Mood and Affect: Mood normal.     Labs reviewed: Basic Metabolic Panel: Recent Labs    10/28/23 1437  NA 139  K 3.8  CL 104  CO2 28  GLUCOSE 94  BUN 12  CREATININE 0.84  CALCIUM 9.3   Liver Function Tests: No results for input(s): AST, ALT, ALKPHOS, BILITOT, PROT, ALBUMIN in the last 8760 hours. No results for input(s): LIPASE, AMYLASE in the last 8760 hours. No results for input(s): AMMONIA in the last 8760 hours. CBC: Recent Labs    10/28/23 1437  WBC 6.3  NEUTROABS 3,434  HGB 13.4  HCT 39.1  MCV 92.2  PLT 309   Lipid Panel: No results for input(s): CHOL, HDL, LDLCALC, TRIG, CHOLHDL, LDLDIRECT in the last 8760 hours. No results found for:  HGBA1C  Procedures: No results found.  Assessment/Plan Assessment  and Plan Assessment & Plan Menorrhagia Prolonged bleeding may be due to Nexplanon  per gyn - Ordered hemoglobin and blood count for anemia assessment. - Advised gynecologist follow-up. - Encouraged hydration.  Essential hypertension Blood pressure controlled on current medication. - Continue losartan  hydrochlorothiazide . - Checked electrolytes, kidney, and liver function.  Hyperlipidemia Borderline high cholesterol levels. - Ordered cholesterol panel.  Obstructive sleep apnea Difficulty with CPAP. Discussed weight loss and Zepbound as potential aids. Explained Zepbound's mechanism and side effects. - Prescribed Zepbound 2.5 mg weekly for 4 weeks. - Advised to report side effects or request dose increase via MyChart, can increase to 5 mg weekly - Encouraged protein intake and portion control. - Advised continued exercise.  Class 1 obesity Weight loss discussed to improve sleep apnea. Emphasized dietary changes and exercise. - Prescribed Zepbound 2.5 mg weekly for 4 weeks. - Advised to report side effects or request dose increase via MyChart. - Encouraged protein intake and portion control. - Advised continued exercise.  Postcholecystectomy diarrhea Chronic diarrhea post-gallbladder removal. Zepbound may help by slowing gastric emptying. - Advised low-fat diet.  Changing pigmented skin lesion Lesion change in size and color.   recommend  evaluation per dermatology  - Referred to dermatology for evaluation and possible biopsy.  Health Maintenance Discussed flu vaccination importance. She declined due to past adverse experience. - Recommended flu vaccination. -wellness visit completed today, The patient was counseled regarding the appropriate use of alcohol, regular self-examination of the breasts on a monthly basis, prevention of dental and periodontal disease, diet, regular sustained exercise for at  least 30 minutes 5 times per week, routine screening interval for mammogram at 40 as recommended by the American Cancer Society and ACOG, importance of regular PAP smears, tobacco use,  and recommended schedule for GI hemoccult testing, colonoscopy, cholesterol, thyroid  and diabetes screening.      Next appt: 3 months  Aleksa Catterton K. Caro BODILY  [facility] [phone]     [1]  Allergies Allergen Reactions   Sulfa Antibiotics Hives

## 2024-08-06 LAB — CBC WITH DIFFERENTIAL/PLATELET
Absolute Lymphocytes: 2480 {cells}/uL (ref 850–3900)
Absolute Monocytes: 462 {cells}/uL (ref 200–950)
Basophils Absolute: 23 {cells}/uL (ref 0–200)
Basophils Relative: 0.4 %
Eosinophils Absolute: 108 {cells}/uL (ref 15–500)
Eosinophils Relative: 1.9 %
HCT: 41.1 % (ref 35.9–46.0)
Hemoglobin: 13.7 g/dL (ref 11.7–15.5)
MCH: 30.9 pg (ref 27.0–33.0)
MCHC: 33.3 g/dL (ref 31.6–35.4)
MCV: 92.8 fL (ref 81.4–101.7)
MPV: 10.6 fL (ref 7.5–12.5)
Monocytes Relative: 8.1 %
Neutro Abs: 2628 {cells}/uL (ref 1500–7800)
Neutrophils Relative %: 46.1 %
Platelets: 344 Thousand/uL (ref 140–400)
RBC: 4.43 Million/uL (ref 3.80–5.10)
RDW: 12.7 % (ref 11.0–15.0)
Total Lymphocyte: 43.5 %
WBC: 5.7 Thousand/uL (ref 3.8–10.8)

## 2024-08-06 LAB — COMPREHENSIVE METABOLIC PANEL WITH GFR
AG Ratio: 1.6 (calc) (ref 1.0–2.5)
ALT: 26 U/L (ref 6–29)
AST: 19 U/L (ref 10–30)
Albumin: 4.5 g/dL (ref 3.6–5.1)
Alkaline phosphatase (APISO): 57 U/L (ref 31–125)
BUN: 11 mg/dL (ref 7–25)
CO2: 28 mmol/L (ref 20–32)
Calcium: 9.9 mg/dL (ref 8.6–10.2)
Chloride: 104 mmol/L (ref 98–110)
Creat: 0.78 mg/dL (ref 0.50–0.97)
Globulin: 2.9 g/dL (ref 1.9–3.7)
Glucose, Bld: 85 mg/dL (ref 65–99)
Potassium: 4.4 mmol/L (ref 3.5–5.3)
Sodium: 139 mmol/L (ref 135–146)
Total Bilirubin: 0.4 mg/dL (ref 0.2–1.2)
Total Protein: 7.4 g/dL (ref 6.1–8.1)
eGFR: 102 mL/min/1.73m2 (ref >=60)

## 2024-08-06 LAB — LIPID PANEL
Cholesterol: 200 mg/dL — ABNORMAL HIGH (ref <200)
HDL: 42 mg/dL — ABNORMAL LOW (ref >=50)
LDL Cholesterol (Calc): 140 mg/dL — ABNORMAL HIGH
Non-HDL Cholesterol (Calc): 158 mg/dL — ABNORMAL HIGH (ref <130)
Total CHOL/HDL Ratio: 4.8 (calc) (ref <5.0)
Triglycerides: 82 mg/dL (ref <150)

## 2024-08-08 ENCOUNTER — Ambulatory Visit: Payer: Self-pay | Admitting: Nurse Practitioner

## 2024-08-08 DIAGNOSIS — E785 Hyperlipidemia, unspecified: Secondary | ICD-10-CM

## 2024-08-08 MED ORDER — SIMVASTATIN 10 MG PO TABS: 10.0000 mg | ORAL_TABLET | Freq: Every day | ORAL | 0 refills | Status: AC

## 2024-11-07 ENCOUNTER — Ambulatory Visit: Payer: PRIVATE HEALTH INSURANCE | Admitting: Nurse Practitioner
